# Patient Record
Sex: Male | Born: 1965 | Race: White | Hispanic: No | Marital: Married | Smoking: Never smoker
Health system: Southern US, Community
[De-identification: ages and names within clinical notes are randomized; demographics above are authoritative.]

## PROBLEM LIST (undated history)

## (undated) DIAGNOSIS — I10 Essential (primary) hypertension: Secondary | ICD-10-CM

## (undated) DIAGNOSIS — Z87442 Personal history of urinary calculi: Secondary | ICD-10-CM

## (undated) DIAGNOSIS — C4491 Basal cell carcinoma of skin, unspecified: Secondary | ICD-10-CM

## (undated) DIAGNOSIS — J309 Allergic rhinitis, unspecified: Secondary | ICD-10-CM

## (undated) HISTORY — DX: Personal history of urinary calculi: Z87.442

## (undated) HISTORY — DX: Basal cell carcinoma of skin, unspecified: C44.91

## (undated) HISTORY — DX: Essential (primary) hypertension: I10

## (undated) HISTORY — PX: COLONOSCOPY: SHX174

## (undated) HISTORY — DX: Allergic rhinitis, unspecified: J30.9

## (undated) HISTORY — PX: ADENOIDECTOMY: SHX5191

## (undated) HISTORY — PX: HERNIA REPAIR: SHX51

## (undated) HISTORY — PX: NASAL FRACTURE SURGERY: SHX718

---

## 2008-11-29 ENCOUNTER — Encounter: Payer: Self-pay | Admitting: Sports Medicine

## 2008-12-05 ENCOUNTER — Ambulatory Visit: Payer: Self-pay | Admitting: Sports Medicine

## 2008-12-05 ENCOUNTER — Ambulatory Visit (HOSPITAL_COMMUNITY): Admission: RE | Admit: 2008-12-05 | Discharge: 2008-12-05 | Payer: Self-pay | Admitting: Sports Medicine

## 2012-11-23 ENCOUNTER — Ambulatory Visit: Payer: Self-pay | Admitting: Unknown Physician Specialty

## 2016-07-06 ENCOUNTER — Encounter: Payer: Self-pay | Admitting: Family Medicine

## 2016-07-06 ENCOUNTER — Ambulatory Visit (INDEPENDENT_AMBULATORY_CARE_PROVIDER_SITE_OTHER): Payer: BLUE CROSS/BLUE SHIELD | Admitting: Family Medicine

## 2016-07-06 VITALS — BP 140/80 | HR 59 | Temp 97.8°F | Ht 68.75 in | Wt 203.2 lb

## 2016-07-06 DIAGNOSIS — Z125 Encounter for screening for malignant neoplasm of prostate: Secondary | ICD-10-CM | POA: Diagnosis not present

## 2016-07-06 DIAGNOSIS — R5383 Other fatigue: Secondary | ICD-10-CM

## 2016-07-06 DIAGNOSIS — Z0000 Encounter for general adult medical examination without abnormal findings: Secondary | ICD-10-CM

## 2016-07-06 DIAGNOSIS — Z13220 Encounter for screening for lipoid disorders: Secondary | ICD-10-CM | POA: Diagnosis not present

## 2016-07-06 NOTE — Progress Notes (Signed)
Pre visit review using our clinic review tool, if applicable. No additional management support is needed unless otherwise documented below in the visit note. 

## 2016-07-06 NOTE — Progress Notes (Signed)
Dr. Frederico Hamman T. Tavarion Babington, MD, [facility] Primary [facility] and Sports Medicine [place], [address] Phone: [phone] Fax: [phone]  07/06/2016  Patient: Alex Mack, MRN: [id number], DOB: [birth year], 50 y.o.  Primary Physician:  Owens Loffler, MD   Chief Complaint  Patient presents with  . Establish Care   Subjective:   Alex Mack is a 50 y.o. pleasant patient who presents with the following:  Preventative Health Maintenance Visit:  Health Maintenance Summary Reviewed and updated, unless pt declines services.  Tobacco History Reviewed. Alcohol: No concerns, no excessive use Exercise Habits: Very active STD concerns: no risk or activity to increase risk Drug Use: None Encouraged self-testicular check  Nodule r chest.  Health Maintenance  Topic Date Due  . Hepatitis C Screening  28-May-1966  . HIV Screening  04/10/1979  . TETANUS/TDAP  04/10/1983  . INFLUENZA VACCINE  05/19/2016  . COLONOSCOPY  10/19/2024    There is no immunization history on file for this patient. Patient Active Problem List   Diagnosis Date Noted  . Allergic rhinitis   . History of kidney stones    Past Medical History:  Diagnosis Date  . Allergic rhinitis   . Basal cell carcinoma   . History of kidney stones   . Hypertension    Past Surgical History:  Procedure Laterality Date  . ADENOIDECTOMY    . HERNIA REPAIR    . NASAL FRACTURE SURGERY     Social History   Social History  . Marital status: Married    Spouse name: N/A  . Number of children: N/A  . Years of education: N/A   Occupational History  . Physical Therapist     [employer]   Social History Main Topics  . Smoking status: Never Smoker  . Smokeless tobacco: Former Systems developer  . Alcohol use 1.2 oz/week    2 Cans of beer per week     Comment: 2 beer a month  . Drug use: No  . Sexual activity: Yes    Partners: Female   Other Topics Concern  . Not on file   Social  History Narrative   Physical Therapist, [place]   Married with Doctor, general practice. (Leisure centre manager)   Some MMA, Muay [place], BJJ in the past   Family History  Problem Relation Age of Onset  . Hypertension Father    No Known Allergies  Medication list has been reviewed and updated.   General: Denies fever, chills, sweats. No significant weight loss. Eyes: Denies blurring,significant itching ENT: Denies earache, sore throat, and hoarseness. Cardiovascular: Denies chest pains, palpitations, dyspnea on exertion Respiratory: Denies cough, dyspnea at rest,wheeezing Breast: no concerns about lumps GI: Denies nausea, vomiting, diarrhea, constipation, change in bowel habits, abdominal pain, melena, hematochezia GU: Denies penile discharge, ED, urinary flow / outflow problems. No STD concerns. Musculoskeletal: Denies back pain, joint pain Derm: Denies rash, itching Neuro: Denies  paresthesias, frequent falls, frequent headaches Psych: Denies depression, anxiety Endocrine: Denies cold intolerance, heat intolerance, polydipsia Heme: Denies enlarged lymph nodes Allergy: No hayfever  Objective:   BP 140/80   Pulse (!) 59   Temp 97.8 F (36.6 C) (Oral)   Ht 5' 8.75" (1.746 m)   Wt 203 lb 4 oz (92.2 kg)   BMI 30.23 kg/m  Ideal Body Weight: Weight in (lb) to have BMI = 25: 167.7  No exam data present  GEN: well developed, well nourished, no acute distress Eyes: conjunctiva and  lids normal, PERRLA, EOMI ENT: TM clear, nares clear, oral exam WNL Neck: supple, no lymphadenopathy, no thyromegaly, no JVD Pulm: clear to auscultation and percussion, respiratory effort normal CV: regular rate and rhythm, S1-S2, no murmur, rub or gallop, no bruits, peripheral pulses normal and symmetric, no cyanosis, clubbing, edema or varicosities GI: soft, non-tender; no hepatosplenomegaly, masses; active bowel sounds all quadrants GU: no hernia, testicular mass, penile discharge Lymph:  no cervical, axillary or inguinal adenopathy MSK: gait normal, muscle tone and strength WNL, no joint swelling, effusions, discoloration, crepitus  SKIN: clear, good turgor, color WNL, no rashes, lesions, or ulcerations. SMALL NODULE/ CYST AT RIGHT CHEST Neuro: normal mental status, normal strength, sensation, and motion Psych: alert; oriented to person, place and time, normally interactive and not anxious or depressed in appearance. All labs reviewed with patient.  Lipids: No results found for: CHOL, TRIG, HDL, LDLDIRECT, VLDL, CHOLHDL CBC: No flowsheet data found.  Basic Metabolic Panel: No results found for: NA, K, CL, CO2, BUN, CREATININE, GLUCOSE, CALCIUM No flowsheet data found.  No results found for: TSH No results found for: PSA  Assessment and Plan:   Healthcare maintenance  Screening PSA (prostate specific antigen) - Plan: PSA  Screening cholesterol level - Plan: Lipid panel  Other fatigue - Plan: Basic metabolic panel, CBC with Differential/Platelet, Hepatic function panel  Health Maintenance Exam: The patient's preventative maintenance and recommended screening tests for an annual wellness exam were reviewed in full today. Brought up to date unless services declined.  Counselled on the importance of diet, exercise, and its role in overall health and mortality. The patient's FH and SH was reviewed, including their home life, tobacco status, and drug and alcohol status.  Follow-up: No Follow-up on file. Unless noted, follow-up in 1 year for Health Maintenance Exam.  Orders Placed This Encounter  Procedures  . Basic metabolic panel  . CBC with Differential/Platelet  . Hepatic function panel  . PSA  . Lipid panel    Signed,  Frederico Hamman T. Clementina Mareno, MD   Patient's Medications   No medications on file

## 2016-07-07 ENCOUNTER — Encounter: Payer: Self-pay | Admitting: Family Medicine

## 2016-07-07 DIAGNOSIS — J309 Allergic rhinitis, unspecified: Secondary | ICD-10-CM | POA: Insufficient documentation

## 2016-07-07 DIAGNOSIS — Z87442 Personal history of urinary calculi: Secondary | ICD-10-CM | POA: Insufficient documentation

## 2016-07-16 ENCOUNTER — Ambulatory Visit: Payer: Self-pay | Admitting: Family Medicine

## 2016-09-02 ENCOUNTER — Other Ambulatory Visit (INDEPENDENT_AMBULATORY_CARE_PROVIDER_SITE_OTHER): Payer: BLUE CROSS/BLUE SHIELD

## 2016-09-02 ENCOUNTER — Encounter: Payer: Self-pay | Admitting: Family Medicine

## 2016-09-02 DIAGNOSIS — Z1159 Encounter for screening for other viral diseases: Secondary | ICD-10-CM

## 2016-09-02 DIAGNOSIS — Z114 Encounter for screening for human immunodeficiency virus [HIV]: Secondary | ICD-10-CM

## 2016-09-02 DIAGNOSIS — Z13220 Encounter for screening for lipoid disorders: Secondary | ICD-10-CM | POA: Diagnosis not present

## 2016-09-02 DIAGNOSIS — Z125 Encounter for screening for malignant neoplasm of prostate: Secondary | ICD-10-CM

## 2016-09-02 DIAGNOSIS — R5383 Other fatigue: Secondary | ICD-10-CM

## 2016-09-03 LAB — HEPATITIS C ANTIBODY: HCV AB: NEGATIVE

## 2016-09-03 LAB — HEPATIC FUNCTION PANEL
ALBUMIN: 4.3 g/dL (ref 3.5–5.2)
ALT: 37 U/L (ref 0–53)
AST: 24 U/L (ref 0–37)
Alkaline Phosphatase: 75 U/L (ref 39–117)
BILIRUBIN DIRECT: 0.1 mg/dL (ref 0.0–0.3)
TOTAL PROTEIN: 7.1 g/dL (ref 6.0–8.3)
Total Bilirubin: 0.3 mg/dL (ref 0.2–1.2)

## 2016-09-03 LAB — CBC WITH DIFFERENTIAL/PLATELET
BASOS PCT: 1 % (ref 0.0–3.0)
Basophils Absolute: 0.1 10*3/uL (ref 0.0–0.1)
EOS ABS: 0.3 10*3/uL (ref 0.0–0.7)
EOS PCT: 3.1 % (ref 0.0–5.0)
HEMATOCRIT: 44.4 % (ref 39.0–52.0)
Hemoglobin: 15.2 g/dL (ref 13.0–17.0)
LYMPHS PCT: 31.4 % (ref 12.0–46.0)
Lymphs Abs: 2.9 10*3/uL (ref 0.7–4.0)
MCHC: 34.2 g/dL (ref 30.0–36.0)
MCV: 92.4 fl (ref 78.0–100.0)
Monocytes Absolute: 0.7 10*3/uL (ref 0.1–1.0)
Monocytes Relative: 7.1 % (ref 3.0–12.0)
NEUTROS ABS: 5.3 10*3/uL (ref 1.4–7.7)
Neutrophils Relative %: 57.4 % (ref 43.0–77.0)
PLATELETS: 260 10*3/uL (ref 150.0–400.0)
RBC: 4.8 Mil/uL (ref 4.22–5.81)
RDW: 12.8 % (ref 11.5–15.5)
WBC: 9.2 10*3/uL (ref 4.0–10.5)

## 2016-09-03 LAB — BASIC METABOLIC PANEL
BUN: 20 mg/dL (ref 6–23)
CHLORIDE: 104 meq/L (ref 96–112)
CO2: 30 meq/L (ref 19–32)
CREATININE: 0.95 mg/dL (ref 0.40–1.50)
Calcium: 9.5 mg/dL (ref 8.4–10.5)
GFR: 88.35 mL/min (ref >60.00)
Glucose, Bld: 85 mg/dL (ref 70–99)
POTASSIUM: 4.5 meq/L (ref 3.5–5.1)
Sodium: 141 mEq/L (ref 135–145)

## 2016-09-03 LAB — HIV ANTIBODY (ROUTINE TESTING W REFLEX): HIV 1&2 Ab, 4th Generation: NONREACTIVE

## 2016-09-03 LAB — LIPID PANEL
Cholesterol: 151 mg/dL (ref 0–200)
HDL: 38.5 mg/dL — AB (ref >39.00)
LDL Cholesterol: 84 mg/dL (ref 0–99)
NONHDL: 112.06
Total CHOL/HDL Ratio: 4
Triglycerides: 141 mg/dL (ref 0.0–149.0)
VLDL: 28.2 mg/dL (ref 0.0–40.0)

## 2016-09-03 LAB — PSA: PSA: 1.23 ng/mL (ref 0.10–4.00)

## 2016-09-04 ENCOUNTER — Encounter: Payer: Self-pay | Admitting: Radiology

## 2016-09-04 NOTE — Progress Notes (Signed)
LMOM for pt to call and schedule a morning, fasting lab appt.

## 2016-09-09 ENCOUNTER — Other Ambulatory Visit: Payer: Self-pay | Admitting: Family Medicine

## 2016-09-09 DIAGNOSIS — R5383 Other fatigue: Secondary | ICD-10-CM

## 2016-09-14 ENCOUNTER — Other Ambulatory Visit (INDEPENDENT_AMBULATORY_CARE_PROVIDER_SITE_OTHER): Payer: BLUE CROSS/BLUE SHIELD

## 2016-09-14 DIAGNOSIS — R5383 Other fatigue: Secondary | ICD-10-CM

## 2016-09-17 ENCOUNTER — Telehealth: Payer: Self-pay | Admitting: Family Medicine

## 2016-09-17 NOTE — Telephone Encounter (Signed)
Pt called checking on his labs from 11/27.

## 2016-09-17 NOTE — Telephone Encounter (Signed)
Test is still is process.  Spoke with Aniceto Boss in the lab who states this tests takes about a week to get results back.  Mr. Nanna notified.

## 2016-09-19 LAB — TESTOS,TOTAL,FREE AND SHBG (FEMALE)
SEX HORMONE BINDING GLOB.: 36 nmol/L (ref 10–50)
TESTOSTERONE,TOTAL,LC/MS/MS: 574 ng/dL (ref 250–1100)
Testosterone, Free: 86.1 pg/mL (ref 35.0–155.0)

## 2016-09-20 ENCOUNTER — Encounter: Payer: Self-pay | Admitting: Family Medicine

## 2016-12-16 ENCOUNTER — Encounter: Payer: Self-pay | Admitting: Family Medicine

## 2017-08-09 ENCOUNTER — Encounter: Payer: Self-pay | Admitting: Family Medicine

## 2017-08-18 ENCOUNTER — Ambulatory Visit (INDEPENDENT_AMBULATORY_CARE_PROVIDER_SITE_OTHER): Payer: BLUE CROSS/BLUE SHIELD | Admitting: Family Medicine

## 2017-08-18 ENCOUNTER — Encounter: Payer: Self-pay | Admitting: Family Medicine

## 2017-08-18 VITALS — BP 118/84 | HR 67 | Temp 97.4°F | Ht 69.0 in | Wt 201.5 lb

## 2017-08-18 DIAGNOSIS — Z23 Encounter for immunization: Secondary | ICD-10-CM | POA: Diagnosis not present

## 2017-08-18 DIAGNOSIS — R5383 Other fatigue: Secondary | ICD-10-CM

## 2017-08-18 DIAGNOSIS — Z0000 Encounter for general adult medical examination without abnormal findings: Secondary | ICD-10-CM

## 2017-08-18 DIAGNOSIS — Z125 Encounter for screening for malignant neoplasm of prostate: Secondary | ICD-10-CM

## 2017-08-18 LAB — BASIC METABOLIC PANEL
BUN: 21 mg/dL (ref 6–23)
CALCIUM: 9.3 mg/dL (ref 8.4–10.5)
CHLORIDE: 105 meq/L (ref 96–112)
CO2: 31 meq/L (ref 19–32)
Creatinine, Ser: 1.03 mg/dL (ref 0.40–1.50)
GFR: 80.18 mL/min (ref >60.00)
Glucose, Bld: 94 mg/dL (ref 70–99)
POTASSIUM: 4.7 meq/L (ref 3.5–5.1)
SODIUM: 140 meq/L (ref 135–145)

## 2017-08-18 LAB — LIPID PANEL
Cholesterol: 151 mg/dL (ref 0–200)
HDL: 47 mg/dL (ref >39.00)
LDL Cholesterol: 92 mg/dL (ref 0–99)
NonHDL: 104.14
TRIGLYCERIDES: 62 mg/dL (ref 0.0–149.0)
Total CHOL/HDL Ratio: 3
VLDL: 12.4 mg/dL (ref 0.0–40.0)

## 2017-08-18 LAB — CBC WITH DIFFERENTIAL/PLATELET
BASOS ABS: 0 10*3/uL (ref 0.0–0.1)
BASOS PCT: 0.7 % (ref 0.0–3.0)
Eosinophils Absolute: 0.3 10*3/uL (ref 0.0–0.7)
Eosinophils Relative: 5.6 % — ABNORMAL HIGH (ref 0.0–5.0)
HEMATOCRIT: 48.6 % (ref 39.0–52.0)
Hemoglobin: 16.4 g/dL (ref 13.0–17.0)
LYMPHS ABS: 2.1 10*3/uL (ref 0.7–4.0)
LYMPHS PCT: 41.7 % (ref 12.0–46.0)
MCHC: 33.8 g/dL (ref 30.0–36.0)
MCV: 95.3 fl (ref 78.0–100.0)
Monocytes Absolute: 0.5 10*3/uL (ref 0.1–1.0)
Monocytes Relative: 9.3 % (ref 3.0–12.0)
NEUTROS ABS: 2.1 10*3/uL (ref 1.4–7.7)
NEUTROS PCT: 42.7 % — AB (ref 43.0–77.0)
PLATELETS: 205 10*3/uL (ref 150.0–400.0)
RBC: 5.09 Mil/uL (ref 4.22–5.81)
RDW: 13 % (ref 11.5–15.5)
WBC: 5 10*3/uL (ref 4.0–10.5)

## 2017-08-18 LAB — HEPATIC FUNCTION PANEL
ALBUMIN: 4.2 g/dL (ref 3.5–5.2)
ALK PHOS: 75 U/L (ref 39–117)
ALT: 55 U/L — ABNORMAL HIGH (ref 0–53)
AST: 34 U/L (ref 0–37)
Bilirubin, Direct: 0.1 mg/dL (ref 0.0–0.3)
TOTAL PROTEIN: 6.9 g/dL (ref 6.0–8.3)
Total Bilirubin: 0.7 mg/dL (ref 0.2–1.2)

## 2017-08-18 LAB — PSA: PSA: 2.1 ng/mL (ref 0.10–4.00)

## 2017-08-18 NOTE — Progress Notes (Signed)
Dr. Frederico Hamman T. Sevan Mcbroom, MD, [facility] Primary Care and Sports Medicine [place], [address] Phone: [phone] Fax: [phone]  08/18/2017  Patient: Alex Mack, MRN: [id number], DOB: 1966-05-18, 51 y.o.  Primary Physician:  Owens Loffler, MD   Chief Complaint  Patient presents with  . Annual Exam   Subjective:   Alex Mack is a 51 y.o. pleasant patient who presents with the following:  Preventative Health Maintenance Visit:  Health Maintenance Summary Reviewed and updated, unless pt declines services.  Tobacco History Reviewed. Alcohol: No concerns, no excessive use Exercise Habits: Daily, very active STD concerns: no risk or activity to increase risk Drug Use: None Encouraged self-testicular check  Doing well overall with little complaints other than some about recovery  Health Maintenance  Topic Date Due  . TETANUS/TDAP  08/18/2018 (Originally 04/10/1983)  . COLONOSCOPY  10/19/2024  . INFLUENZA VACCINE  Completed  . Hepatitis C Screening  Completed  . HIV Screening  Completed   Immunization History  Administered Date(s) Administered  . Influenza,inj,Quad PF,6+ Mos 08/18/2017   Patient Active Problem List   Diagnosis Date Noted  . Allergic rhinitis   . History of kidney stones    Past Medical History:  Diagnosis Date  . Allergic rhinitis   . Basal cell carcinoma   . History of kidney stones   . Hypertension    Past Surgical History:  Procedure Laterality Date  . ADENOIDECTOMY    . HERNIA REPAIR    . NASAL FRACTURE SURGERY     Social History   Social History  . Marital status: Married    Spouse name: N/A  . Number of children: N/A  . Years of education: N/A   Occupational History  . Physical Therapist     [employer]   Social History Main Topics  . Smoking status: Never Smoker  . Smokeless tobacco: Former Systems developer  . Alcohol use 1.2 oz/week    2 Cans of beer per week     Comment: 2 beer a  month  . Drug use: No  . Sexual activity: Yes    Partners: Female   Other Topics Concern  . 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 (Leisure centre manager)   Some MMA, Muay [place], BJJ in the past   Family History  Problem Relation Age of Onset  . Hypertension Father    No Known Allergies  Medication list has been reviewed and updated.   General: Denies fever, chills, sweats. No significant weight loss. Eyes: Denies blurring,significant itching ENT: Denies earache, sore throat, and hoarseness. Cardiovascular: Denies chest pains, palpitations, dyspnea on exertion Respiratory: Denies cough, dyspnea at rest,wheeezing Breast: no concerns about lumps GI: Denies nausea, vomiting, diarrhea, constipation, change in bowel habits, abdominal pain, melena, hematochezia GU: Denies penile discharge, ED, urinary flow / outflow problems. No STD concerns. Musculoskeletal: Denies back pain, joint pain Derm: Denies rash, itching Neuro: Denies  paresthesias, frequent falls, frequent headaches Psych: Denies depression, anxiety Endocrine: Denies cold intolerance, heat intolerance, polydipsia Heme: Denies enlarged lymph nodes Allergy: No hayfever  Objective:   BP 118/84   Pulse 67   Temp (!) 97.4 F (36.3 C) (Oral)   Ht 5' 9"  (1.753 m)   Wt 201 lb 8 oz (91.4 kg)   BMI 29.76 kg/m  Ideal Body Weight: Weight in (lb) to have BMI = 25: 168.9  No exam  data present  GEN: well developed, well nourished, no acute distress Eyes: conjunctiva and lids normal, PERRLA, EOMI ENT: TM clear, nares clear, oral exam WNL Neck: supple, no lymphadenopathy, no thyromegaly, no JVD Pulm: clear to auscultation and percussion, respiratory effort normal CV: regular rate and rhythm, S1-S2, no murmur, rub or gallop, no bruits, peripheral pulses normal and symmetric, no cyanosis, clubbing, edema or varicosities GI: soft, non-tender; no  hepatosplenomegaly, masses; active bowel sounds all quadrants GU: no hernia, testicular mass, penile discharge Lymph: no cervical, axillary or inguinal adenopathy MSK: gait normal, muscle tone and strength WNL, no joint swelling, effusions, discoloration, crepitus  SKIN: clear, good turgor, color WNL, no rashes, lesions, or ulcerations Neuro: normal mental status, normal strength, sensation, and motion Psych: alert; oriented to person, place and time, normally interactive and not anxious or depressed in appearance. All labs reviewed with patient.  Lipids:    Component Value Date/Time   CHOL 151 09/02/2016 1812   TRIG 141.0 09/02/2016 1812   HDL 38.50 (L) 09/02/2016 1812   VLDL 28.2 09/02/2016 1812   CHOLHDL 4 09/02/2016 1812   CBC: CBC Latest Ref Rng & Units 09/02/2016  WBC 4.0 - 10.5 K/uL 9.2  Hemoglobin 13.0 - 17.0 g/dL 15.2  Hematocrit 39.0 - 52.0 % 44.4  Platelets 150.0 - 400.0 K/uL 016.0    Basic Metabolic Panel:    Component Value Date/Time   NA 141 09/02/2016 1812   K 4.5 09/02/2016 1812   CL 104 09/02/2016 1812   CO2 30 09/02/2016 1812   BUN 20 09/02/2016 1812   CREATININE 0.95 09/02/2016 1812   GLUCOSE 85 09/02/2016 1812   CALCIUM 9.5 09/02/2016 1812   Hepatic Function Latest Ref Rng & Units 09/02/2016  Total Protein 6.0 - 8.3 g/dL 7.1  Albumin 3.5 - 5.2 g/dL 4.3  AST 0 - 37 U/L 24  ALT 0 - 53 U/L 37  Alk Phosphatase 39 - 117 U/L 75  Total Bilirubin 0.2 - 1.2 mg/dL 0.3  Bilirubin, Direct 0.0 - 0.3 mg/dL 0.1    No results found for: TSH Lab Results  Component Value Date   PSA 1.23 09/02/2016    Assessment and Plan:   Healthcare maintenance - Plan: Basic metabolic panel, CBC with Differential/Platelet, Hepatic function panel, Lipid panel, PSA  Other fatigue - Plan: Testos,Total,Free and SHBG (Male)  Need for prophylactic vaccination and inoculation against influenza - Plan: Flu Vaccine QUAD 36+ mos IM  Health Maintenance Exam: The patient's  preventative maintenance and recommended screening tests for an annual wellness exam were reviewed in full today. Brought up to date unless services declined.  Counselled on the importance of diet, exercise, and its role in overall health and mortality. The patient's FH and SH was reviewed, including their home life, tobacco status, and drug and alcohol status.  Follow-up in 1 year for physical exam or additional follow-up below.  Follow-up: No Follow-up on file. Or follow-up in 1 year if not noted.  Orders Placed This Encounter  Procedures  . Flu Vaccine QUAD 36+ mos IM  . Basic metabolic panel  . CBC with Differential/Platelet  . Hepatic function panel  . Lipid panel  . PSA  . Testos,Total,Free and SHBG (Male)    Signed,  Frederico Hamman T. Damiano Stamper, MD   Allergies as of 08/18/2017   No Known Allergies     Medication List    as of 08/18/2017  9:42 AM   You have not been prescribed any medications.

## 2017-08-22 LAB — TESTOS,TOTAL,FREE AND SHBG (FEMALE)
FREE TESTOSTERONE: 73.6 pg/mL (ref 35.0–155.0)
Sex Hormone Binding: 35 nmol/L (ref 10–50)
Testosterone, Total, LC-MS-MS: 481 ng/dL (ref 250–1100)

## 2017-10-22 ENCOUNTER — Encounter: Payer: Self-pay | Admitting: Family Medicine

## 2019-01-12 ENCOUNTER — Encounter: Payer: BLUE CROSS/BLUE SHIELD | Admitting: Family Medicine

## 2019-03-22 ENCOUNTER — Telehealth: Payer: Self-pay | Admitting: Family Medicine

## 2019-03-22 NOTE — Telephone Encounter (Signed)
error 

## 2019-03-23 ENCOUNTER — Other Ambulatory Visit: Payer: Self-pay

## 2019-03-23 ENCOUNTER — Encounter: Payer: Self-pay | Admitting: Family Medicine

## 2019-03-23 ENCOUNTER — Ambulatory Visit (INDEPENDENT_AMBULATORY_CARE_PROVIDER_SITE_OTHER): Payer: PRIVATE HEALTH INSURANCE | Admitting: Family Medicine

## 2019-03-23 VITALS — BP 120/76 | HR 60 | Temp 98.2°F | Ht 69.0 in | Wt 200.0 lb

## 2019-03-23 DIAGNOSIS — Z0000 Encounter for general adult medical examination without abnormal findings: Secondary | ICD-10-CM | POA: Diagnosis not present

## 2019-03-23 DIAGNOSIS — Z125 Encounter for screening for malignant neoplasm of prostate: Secondary | ICD-10-CM

## 2019-03-23 DIAGNOSIS — Z23 Encounter for immunization: Secondary | ICD-10-CM

## 2019-03-23 DIAGNOSIS — Z131 Encounter for screening for diabetes mellitus: Secondary | ICD-10-CM | POA: Diagnosis not present

## 2019-03-23 LAB — CBC WITH DIFFERENTIAL/PLATELET
Basophils Absolute: 0 10*3/uL (ref 0.0–0.1)
Basophils Relative: 0.5 % (ref 0.0–3.0)
Eosinophils Absolute: 0.3 10*3/uL (ref 0.0–0.7)
Eosinophils Relative: 5.9 % — ABNORMAL HIGH (ref 0.0–5.0)
HCT: 47.9 % (ref 39.0–52.0)
Hemoglobin: 16.3 g/dL (ref 13.0–17.0)
Lymphocytes Relative: 36.1 % (ref 12.0–46.0)
Lymphs Abs: 1.7 10*3/uL (ref 0.7–4.0)
MCHC: 34.1 g/dL (ref 30.0–36.0)
MCV: 94.6 fl (ref 78.0–100.0)
Monocytes Absolute: 0.4 10*3/uL (ref 0.1–1.0)
Monocytes Relative: 9.2 % (ref 3.0–12.0)
Neutro Abs: 2.3 10*3/uL (ref 1.4–7.7)
Neutrophils Relative %: 48.3 % (ref 43.0–77.0)
Platelets: 196 10*3/uL (ref 150.0–400.0)
RBC: 5.06 Mil/uL (ref 4.22–5.81)
RDW: 12.8 % (ref 11.5–15.5)
WBC: 4.7 10*3/uL (ref 4.0–10.5)

## 2019-03-23 LAB — HEPATIC FUNCTION PANEL
ALT: 47 U/L (ref 0–53)
AST: 30 U/L (ref 0–37)
Albumin: 4.2 g/dL (ref 3.5–5.2)
Alkaline Phosphatase: 83 U/L (ref 39–117)
Bilirubin, Direct: 0.1 mg/dL (ref 0.0–0.3)
Total Bilirubin: 0.7 mg/dL (ref 0.2–1.2)
Total Protein: 6.9 g/dL (ref 6.0–8.3)

## 2019-03-23 LAB — BASIC METABOLIC PANEL
BUN: 19 mg/dL (ref 6–23)
CO2: 28 mEq/L (ref 19–32)
Calcium: 9.4 mg/dL (ref 8.4–10.5)
Chloride: 105 mEq/L (ref 96–112)
Creatinine, Ser: 1.19 mg/dL (ref 0.40–1.50)
GFR: 63.48 mL/min (ref >60.00)
Glucose, Bld: 96 mg/dL (ref 70–99)
Potassium: 4.8 mEq/L (ref 3.5–5.1)
Sodium: 141 mEq/L (ref 135–145)

## 2019-03-23 LAB — LIPID PANEL
Cholesterol: 164 mg/dL (ref 0–200)
HDL: 50.9 mg/dL (ref >39.00)
LDL Cholesterol: 100 mg/dL — ABNORMAL HIGH (ref 0–99)
NonHDL: 113.39
Total CHOL/HDL Ratio: 3
Triglycerides: 67 mg/dL (ref 0.0–149.0)
VLDL: 13.4 mg/dL (ref 0.0–40.0)

## 2019-03-23 LAB — HEMOGLOBIN A1C: Hgb A1c MFr Bld: 5.5 % (ref 4.6–6.5)

## 2019-03-23 NOTE — Progress Notes (Signed)
Garielle Mroz T. Pink Maye, MD Primary Care and [place] at [facility] [place], [address] Phone: [phone]  FAX: [phone]  DENIZ HANNAN - 53 y.o. male  MRN [id number]  Date of Birth: 01-Apr-1966  Visit Date: 03/23/2019  PCP: Owens Loffler, MD  Referred by: Owens Loffler, MD  Chief Complaint  Patient presents with  . Annual Exam   Patient Care Team: Owens Loffler, MD as PCP - General (Family Medicine) Subjective:   Alex Mack is a 53 y.o. pleasant patient who presents with the following:  Preventative Health Maintenance Visit:  Health Maintenance Summary Reviewed and updated, unless pt declines services.  Tobacco History Reviewed. Alcohol: No concerns, no excessive use.  Rare. Exercise Habits: Some activity, rec at least 30 mins 5 times a week STD concerns: no risk or activity to increase risk.  Monogamous.  Drug Use: None Encouraged self-testicular check  Tdap booster  Still working out a lot. Doing really well.   Health Maintenance  Topic Date Due  . TETANUS/TDAP  04/10/1983  . INFLUENZA VACCINE  05/20/2019  . COLONOSCOPY  10/19/2024  . Hepatitis C Screening  Completed  . HIV Screening  Completed   Immunization History  Administered Date(s) Administered  . Influenza,inj,Quad PF,6+ Mos 08/18/2017  . Tdap 03/23/2019   Patient Active Problem List   Diagnosis Date Noted  . Allergic rhinitis   . History of kidney stones    Past Medical History:  Diagnosis Date  . Allergic rhinitis   . Basal cell carcinoma   . History of kidney stones   . Hypertension    Past Surgical History:  Procedure Laterality Date  . ADENOIDECTOMY    . HERNIA REPAIR    . NASAL FRACTURE SURGERY     Social History   Socioeconomic History  . Marital status: Married    Spouse name: Not on file  . Number of children: Not on file  . Years of education: Not on file  . Highest education level: Not on file   Occupational History  . Occupation: Physical Therapist    Comment: Administrator, [employer]  Social Needs  . Financial resource strain: Not on file  . Food insecurity:    Worry: Not on file    Inability: Not on file  . Transportation needs:    Medical: Not on file    Non-medical: Not on file  Tobacco Use  . Smoking status: Never Smoker  . Smokeless tobacco: Former Network engineer and Sexual Activity  . Alcohol use: Yes    Alcohol/week: 2.0 standard drinks    Types: 2 Cans of beer per week    Comment: 2 beer a month  . Drug use: No  . Sexual activity: Yes    Partners: Female  Lifestyle  . Physical activity:    Days per week: Not on file    Minutes per session: Not on file  . Stress: Not on file  Relationships  . Social connections:    Talks on phone: Not on file    Gets together: Not on file    Attends religious service: Not on file    Active member of club or organization: Not on file    Attends meetings of clubs or organizations: Not on file    Relationship status: Not on file  . Intimate partner violence:    Fear of current or ex partner: Not on file    Emotionally abused: Not on file  Physically abused: Not on file    Forced sexual activity: Not on file  Other Topics Concern  . Not on file  Social History Narrative   Physical Therapist, [place]   Married with Doctor, general practice. (Leisure centre manager)   Some MMA, Muay [place], BJJ in the past   Family History  Problem Relation Age of Onset  . Hypertension Father    No Known Allergies  Medication list has been reviewed and updated.   General: Denies fever, chills, sweats. No significant weight loss. Eyes: Denies blurring,significant itching ENT: Denies earache, sore throat, and hoarseness. Cardiovascular: Denies chest pains, palpitations, dyspnea on exertion Respiratory: Denies cough, dyspnea at rest,wheeezing Breast: no concerns about lumps GI: Denies nausea, vomiting, diarrhea,  constipation, change in bowel habits, abdominal pain, melena, hematochezia GU: Denies penile discharge, ED, urinary flow / outflow problems. No STD concerns. Musculoskeletal: Denies back pain, joint pain Derm: Denies rash, itching Neuro: Denies  paresthesias, frequent falls, frequent headaches Psych: Denies depression, anxiety Endocrine: Denies cold intolerance, heat intolerance, polydipsia Heme: Denies enlarged lymph nodes Allergy: No hayfever  Objective:   BP 120/76 (BP Location: Left Arm, Patient Position: Sitting, Cuff Size: Large)   Pulse 60   Temp 98.2 F (36.8 C) (Oral)   Ht 5\' 9"  (1.753 m)   Wt 200 lb (90.7 kg)   SpO2 98%   BMI 29.53 kg/m  Ideal Body Weight: Weight in (lb) to have BMI = 25: 168.9  Ideal Body Weight: Weight in (lb) to have BMI = 25: 168.9 No exam data present Depression screen [facility] 2/9 03/23/2019 08/18/2017  Decreased Interest 0 0  Down, Depressed, Hopeless 0 0  PHQ - 2 Score 0 0   GEN: well developed, well nourished, no acute distress Eyes: conjunctiva and lids normal, PERRLA, EOMI ENT: TM clear, nares clear, oral exam WNL Neck: supple, no lymphadenopathy, no thyromegaly, no JVD Pulm: clear to auscultation and percussion, respiratory effort normal CV: regular rate and rhythm, S1-S2, no murmur, rub or gallop, no bruits, peripheral pulses normal and symmetric, no cyanosis, clubbing, edema or varicosities GI: soft, non-tender; no hepatosplenomegaly, masses; active bowel sounds all quadrants GU: no hernia, testicular mass, penile discharge Lymph: no cervical, axillary or inguinal adenopathy MSK: gait normal, muscle tone and strength WNL, no joint swelling, effusions, discoloration, crepitus  SKIN: clear, good turgor, color WNL, no rashes, lesions, or ulcerations Neuro: normal mental status, normal strength, sensation, and motion Psych: alert; oriented to person, place and time, normally interactive and not anxious or depressed in appearance.  All labs  reviewed with patient. Results for orders placed or performed in visit on 82/99/37  Basic metabolic panel  Result Value Ref Range   Sodium 140 135 - 145 mEq/L   Potassium 4.7 3.5 - 5.1 mEq/L   Chloride 105 96 - 112 mEq/L   CO2 31 19 - 32 mEq/L   Glucose, Bld 94 70 - 99 mg/dL   BUN 21 6 - 23 mg/dL   Creatinine, Ser 1.03 0.40 - 1.50 mg/dL   Calcium 9.3 8.4 - 10.5 mg/dL   GFR 80.18 >60.00 mL/min  CBC with Differential/Platelet  Result Value Ref Range   WBC 5.0 4.0 - 10.5 K/uL   RBC 5.09 4.22 - 5.81 Mil/uL   Hemoglobin 16.4 13.0 - 17.0 g/dL   HCT 48.6 39.0 - 52.0 %   MCV 95.3 78.0 - 100.0 fl   MCHC 33.8 30.0 - 36.0 g/dL   RDW 13.0 11.5 - 15.5 %  Platelets 205.0 150.0 - 400.0 K/uL   Neutrophils Relative % 42.7 (L) 43.0 - 77.0 %   Lymphocytes Relative 41.7 12.0 - 46.0 %   Monocytes Relative 9.3 3.0 - 12.0 %   Eosinophils Relative 5.6 (H) 0.0 - 5.0 %   Basophils Relative 0.7 0.0 - 3.0 %   Neutro Abs 2.1 1.4 - 7.7 K/uL   Lymphs Abs 2.1 0.7 - 4.0 K/uL   Monocytes Absolute 0.5 0.1 - 1.0 K/uL   Eosinophils Absolute 0.3 0.0 - 0.7 K/uL   Basophils Absolute 0.0 0.0 - 0.1 K/uL  Hepatic function panel  Result Value Ref Range   Total Bilirubin 0.7 0.2 - 1.2 mg/dL   Bilirubin, Direct 0.1 0.0 - 0.3 mg/dL   Alkaline Phosphatase 75 39 - 117 U/L   AST 34 0 - 37 U/L   ALT 55 (H) 0 - 53 U/L   Total Protein 6.9 6.0 - 8.3 g/dL   Albumin 4.2 3.5 - 5.2 g/dL  Lipid panel  Result Value Ref Range   Cholesterol 151 0 - 200 mg/dL   Triglycerides 62.0 0.0 - 149.0 mg/dL   HDL 47.00 >39.00 mg/dL   VLDL 12.4 0.0 - 40.0 mg/dL   LDL Cholesterol 92 0 - 99 mg/dL   Total CHOL/HDL Ratio 3    NonHDL 104.14   PSA  Result Value Ref Range   PSA 2.10 0.10 - 4.00 ng/mL  Testos,Total,Free and SHBG (Male)  Result Value Ref Range   Testosterone, Total, LC-MS-MS 481 250 - 1,100 ng/dL   Free Testosterone 73.6 35.0 - 155.0 pg/mL   Sex Hormone Binding 35 10 - 50 nmol/L    Assessment and Plan:   Healthcare  maintenance - Plan: CBC with Differential/Platelet, Basic metabolic panel, Hepatic function panel, Hemoglobin A1c, Lipid panel, PSA, total and free  Screening for diabetes mellitus - Plan: Hemoglobin A1c  Screening PSA (prostate specific antigen) - Plan: PSA, total and free  Need for Tdap vaccination - Plan: Tdap vaccine greater than or equal to 7yo IM  Doing really well Update Tdap today  Health Maintenance Exam: The patient's preventative maintenance and recommended screening tests for an annual wellness exam were reviewed in full today. Brought up to date unless services declined.  Counselled on the importance of diet, exercise, and its role in overall health and mortality. The patient's FH and SH was reviewed, including their home life, tobacco status, and drug and alcohol status.  Follow-up in 1 year for physical exam or additional follow-up below.  Follow-up: No follow-ups on file. Or follow-up in 1 year if not noted.  No orders of the defined types were placed in this encounter.  There are no discontinued medications. Orders Placed This Encounter  Procedures  . Tdap vaccine greater than or equal to 7yo IM  . CBC with Differential/Platelet  . Basic metabolic panel  . Hepatic function panel  . Hemoglobin A1c  . Lipid panel  . PSA, total and free    Signed,  Shabria Egley T. Sareena Odeh, MD   Allergies as of 03/23/2019   No Known Allergies     Medication List       Accurate as of March 23, 2019 11:37 AM. If you have any questions, ask your nurse or doctor.        aspirin 81 MG EC tablet Take 81 mg by mouth daily. Swallow whole.   CREATINE PO Take by mouth.

## 2019-03-24 LAB — PSA, TOTAL AND FREE
PSA, % Free: 25 % (calc) — ABNORMAL LOW (ref >25)
PSA, Free: 0.3 ng/mL
PSA, Total: 1.2 ng/mL (ref <=4.0)

## 2019-10-27 ENCOUNTER — Encounter: Payer: Self-pay | Admitting: Family Medicine

## 2019-12-06 ENCOUNTER — Ambulatory Visit (INDEPENDENT_AMBULATORY_CARE_PROVIDER_SITE_OTHER): Payer: PRIVATE HEALTH INSURANCE | Admitting: Family Medicine

## 2019-12-06 ENCOUNTER — Other Ambulatory Visit: Payer: Self-pay

## 2019-12-06 ENCOUNTER — Encounter: Payer: Self-pay | Admitting: Family Medicine

## 2019-12-06 VITALS — BP 142/90 | HR 60 | Temp 98.4°F | Ht 69.0 in | Wt 203.5 lb

## 2019-12-06 DIAGNOSIS — N6310 Unspecified lump in the right breast, unspecified quadrant: Secondary | ICD-10-CM

## 2019-12-06 NOTE — Progress Notes (Signed)
Alex Tortorella T. Kiano Terrien, MD Primary Care and [place] at [facility] [place], [address] Phone: [phone]  FAX: [phone]  Alex Mack - 54 y.o. male  MRN [id number]  Date of Birth: 1966/09/07  Visit Date: 12/06/2019  PCP: Owens Loffler, MD  Referred by: Owens Loffler, MD  Chief Complaint  Patient presents with  . Knot on Chest Wall    This visit occurred during the SARS-CoV-2 public health emergency.  Safety protocols were in place, including screening questions prior to the visit, additional usage of staff PPE, and extensive cleaning of exam room while observing appropriate contact time as indicated for disinfecting solutions.   Subjective:   Alex Mack is a 54 y.o. very pleasant male patient who presents with the following:  He has an knot/mass on his right anterior dell.  We have been observing this time, but he does think it may have gotten bigger.  He also has lost weight and lost fat in his upper chest area as well.  Is not causing him any pain at all.  He has no history of trauma in this region.  He is not noticed any lymph nodes in his eye.  He otherwise is feeling well, but he does have a history of some skin cancers.  Otherwise he is a very healthy 54 year old gentleman.  He is quite physically active, he is doing a lot of Olympic lifting now as well as interval training.  Knot on the chest wall.  Does feel bigger   Has been doing a lot of oly,poc lifting.   R breast mass, 2 o/clock  Review of Systems is noted in the HPI, as appropriate  Objective:   BP (!) 142/90   Pulse 60   Temp 98.4 F (36.9 C) (Temporal)   Ht 5\' 9"  (1.753 m)   Wt 203 lb 8 oz (92.3 kg)   SpO2 96%   BMI 30.05 kg/m   GEN: No acute distress; alert,appropriate. PULM: Breathing comfortably in no respiratory distress PSYCH: Normally interactive.   On the right chest wall at approximately 2:00 there is a mobile area  that is approximately 1 and three-quarter centimeter across.  Otherwise the chest exam is within normal limits and there are no lymph nodes in the axilla on the right  Laboratory and Imaging Data:  Assessment and Plan:     ICD-10-CM   1. Breast mass, right  N63.10 MM DIAG BREAST TOMO BILATERAL    US BREAST LTD UNI RIGHT INC AXILLA   Level of Medical Decision-Making in this case is MODERATE.   In the setting of a male with enlarging area/mass in the right upper quadrant of his breast tissue, obtain a bilateral diagnostic mammogram found to evaluate for potential neoplasm or other pathology.  He is well aware that a breast cancer could potentially be possible in a male.  If all of this testing is negative, then I do not think that there would be any kind of significant risk in the future.  Follow-up: No follow-ups on file.  No orders of the defined types were placed in this encounter.  There are no discontinued medications. Orders Placed This Encounter  Procedures  . MM DIAG BREAST TOMO BILATERAL  . US BREAST LTD UNI RIGHT INC AXILLA    Signed,  Alex Goettel T. Rhiann Boucher, MD   Outpatient Encounter Medications as of 12/06/2019  Medication Sig  . aspirin 81 MG EC tablet Take 81  mg by mouth daily. Swallow whole.  Marland Kitchen CREATINE PO Take by mouth.   No facility-administered encounter medications on file as of 12/06/2019.

## 2019-12-08 ENCOUNTER — Encounter: Payer: Self-pay | Admitting: Family Medicine

## 2019-12-13 ENCOUNTER — Ambulatory Visit: Payer: PRIVATE HEALTH INSURANCE | Admitting: Family Medicine

## 2019-12-14 ENCOUNTER — Ambulatory Visit
Admission: RE | Admit: 2019-12-14 | Discharge: 2019-12-14 | Disposition: A | Discharge status: Home / Self Care | Payer: PRIVATE HEALTH INSURANCE | Source: Ambulatory Visit | Attending: Family Medicine | Admitting: Family Medicine

## 2019-12-14 DIAGNOSIS — N6310 Unspecified lump in the right breast, unspecified quadrant: Secondary | ICD-10-CM

## 2020-01-01 ENCOUNTER — Other Ambulatory Visit: Payer: PRIVATE HEALTH INSURANCE

## 2020-02-26 ENCOUNTER — Encounter: Payer: Self-pay | Admitting: Family Medicine

## 2020-11-05 IMAGING — MG DIGITAL DIAGNOSTIC BILAT W/ TOMO W/ CAD
8 of 14 series · 8 of 40 positions shown · non-contrast
Comparison: None.

ACR Breast Density Category a: The breast tissue is almost entirely
fatty.

CLINICAL DATA: Male patient describing a RIGHT-sided breast lump
for 2 years, slowly increasing over time, more noticeable after
recent weight loss.

EXAM:
DIGITAL DIAGNOSTIC BILATERAL MAMMOGRAM WITH CAD AND TOMO
ULTRASOUND RIGHT BREAST

[L MLO synth-2D]
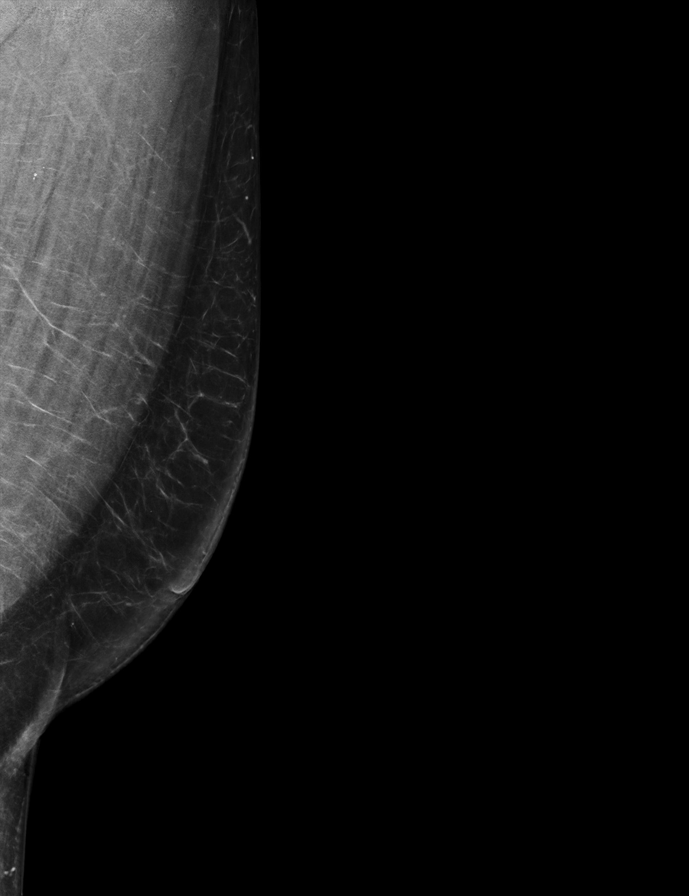

[R TAN synth-2D]
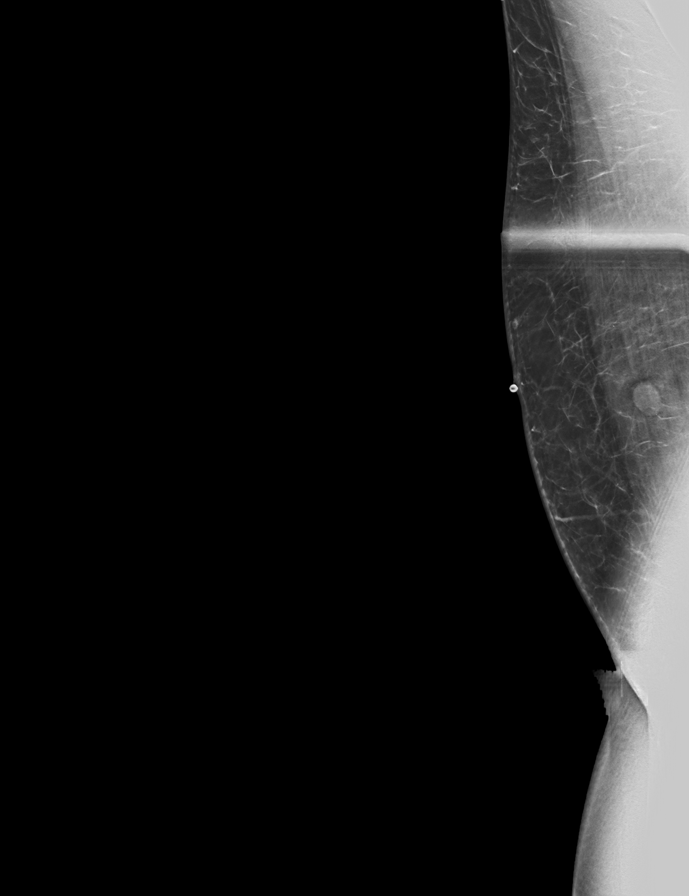

[R CC synth-2D (1 of 2)]
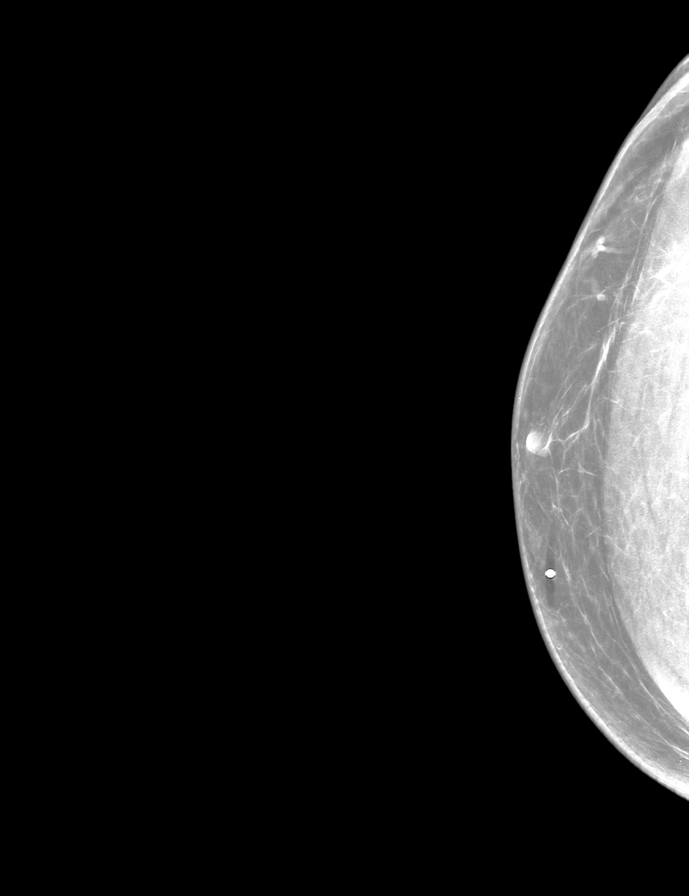

[L CC synth-2D (1 of 2)]
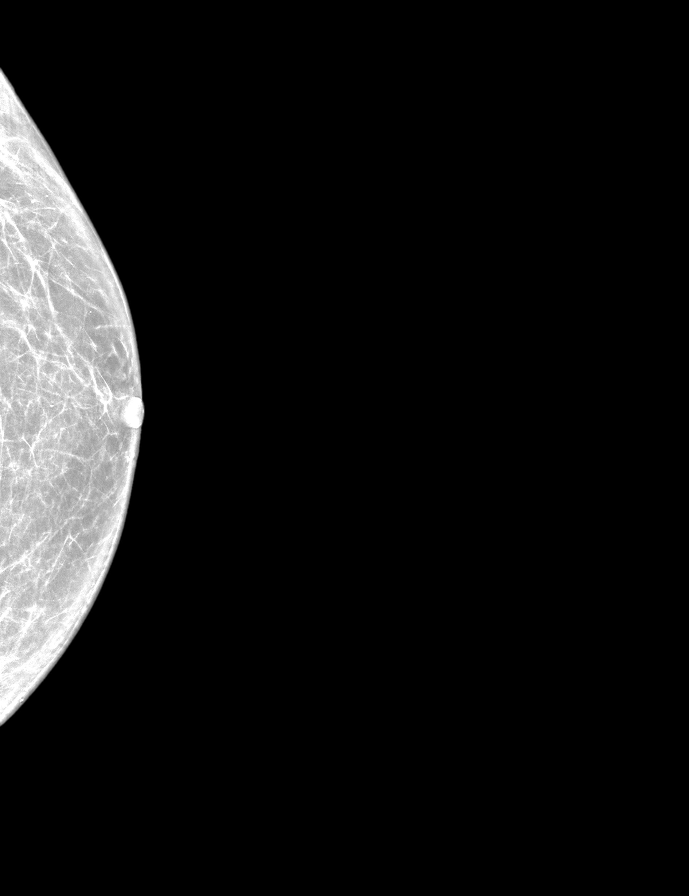

[R MLO synth-2D]
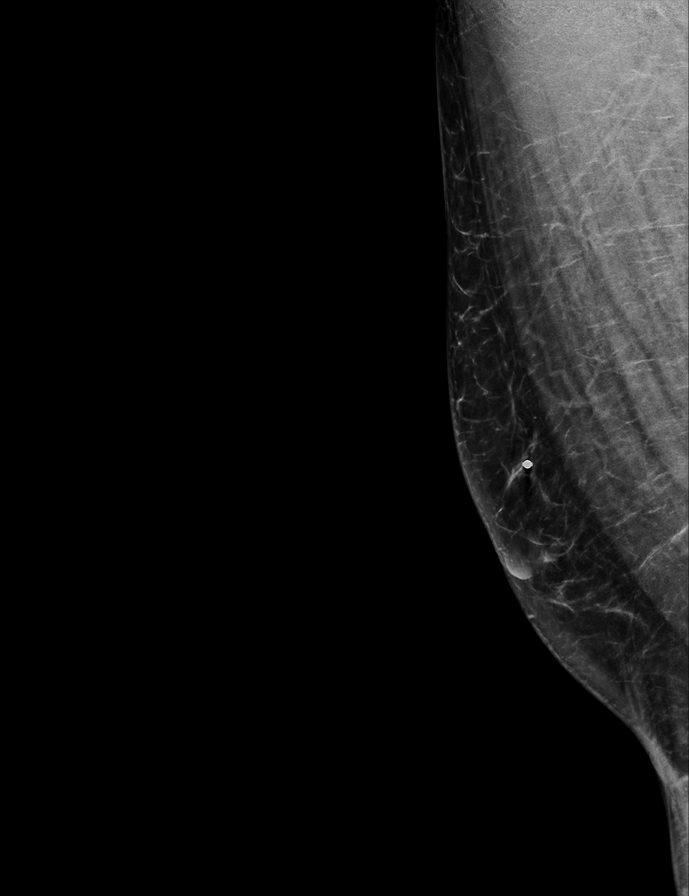

[R CC synth-2D (2 of 2)]
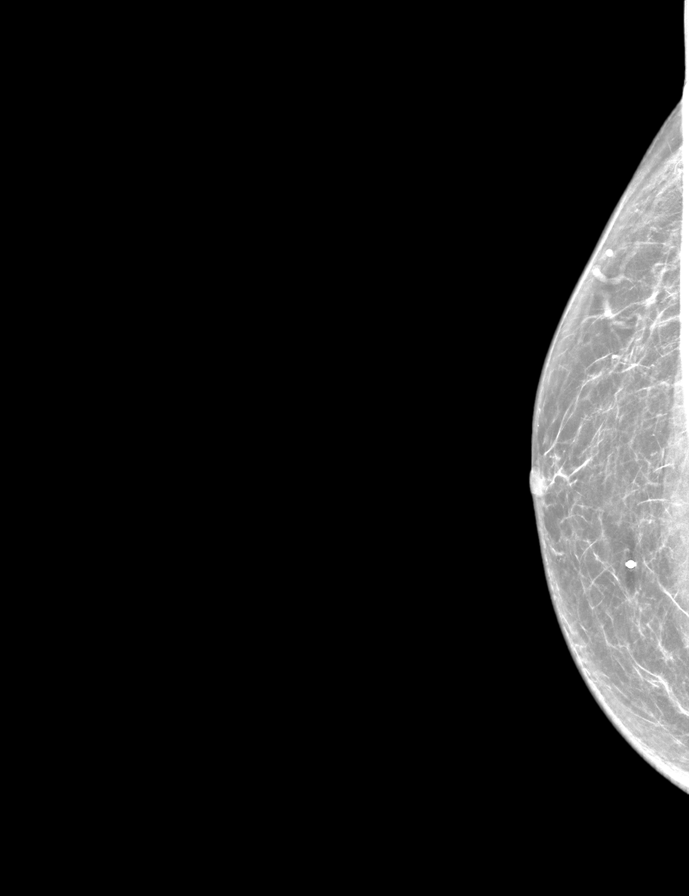

[L CC synth-2D (2 of 2)]
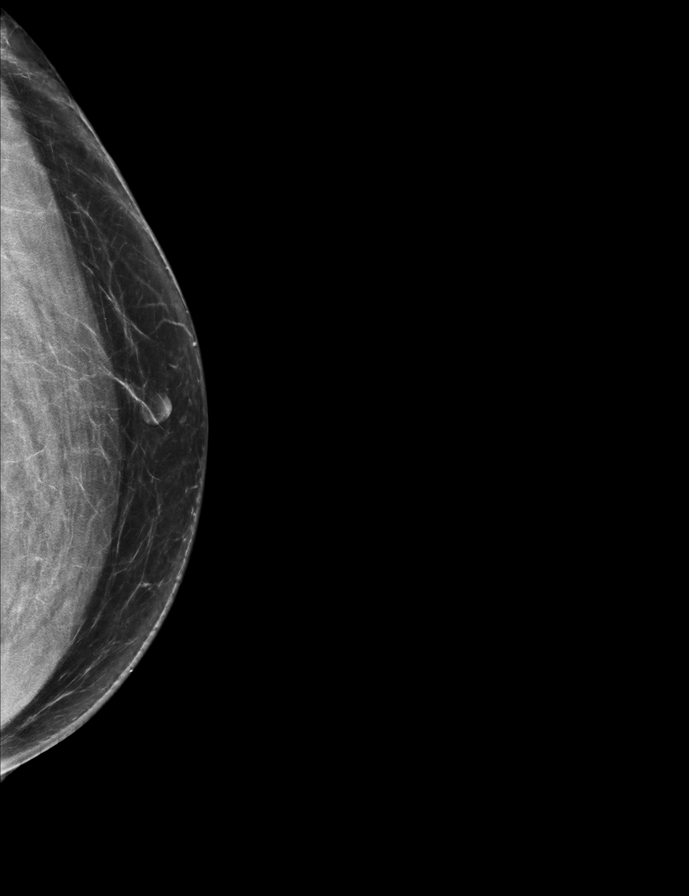

[L MLO tomo · tomo slice 41/80.0]
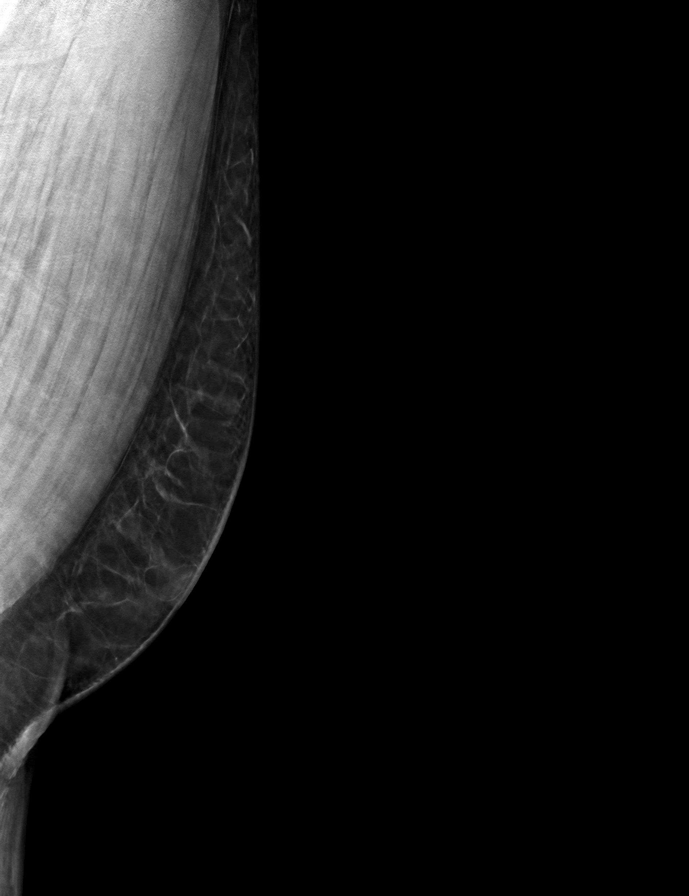

[8 of 40 positions shown; findings below may reference images not displayed]

FINDINGS: There are no suspicious masses, calcifications or secondary signs of
malignancy identified within either breast. Specifically, there is
no mammographic abnormality within the upper inner quadrant of the
RIGHT breast corresponding to the area of clinical concern, with
overlying skin marker in place.

Mammographic images were processed with CAD.

Targeted ultrasound is performed, evaluating the upper inner
quadrant of the RIGHT breast as directed by the patient, showing an
oval circumscribed echogenic mass in the RIGHT breast at the 2
o'clock axis, 5 cm from the nipple, measuring 1.7 cm, compatible
with lipoma.
IMPRESSION: 1. Benign lipoma within the RIGHT breast at the 2 o'clock axis,
measuring 1.7 cm, corresponding to the area of clinical concern.
2. No evidence of malignancy within either breast.

RECOMMENDATION:
1. Clinical follow-up for the benign lipoma.
2. Patient was informed that lipomas can enlarge and can sometimes
cause pain and, if this occurs, therapeutic surgical excision could
be performed.
3. The patient should return for additional imaging if the area that
he feels becomes larger and/or firmer to palpation, or if a new
palpable abnormality is identified in either breast.

I have discussed the findings and recommendations with the patient.
If applicable, a reminder letter will be sent to the patient
regarding the next appointment.

BI-RADS CATEGORY  2: Benign.

## 2020-11-11 ENCOUNTER — Encounter: Payer: Self-pay | Admitting: Family Medicine

## 2020-11-12 ENCOUNTER — Other Ambulatory Visit (INDEPENDENT_AMBULATORY_CARE_PROVIDER_SITE_OTHER): Payer: PRIVATE HEALTH INSURANCE

## 2020-11-12 ENCOUNTER — Other Ambulatory Visit: Payer: Self-pay

## 2020-11-12 DIAGNOSIS — Z131 Encounter for screening for diabetes mellitus: Secondary | ICD-10-CM

## 2020-11-12 DIAGNOSIS — R5383 Other fatigue: Secondary | ICD-10-CM | POA: Diagnosis not present

## 2020-11-12 DIAGNOSIS — E291 Testicular hypofunction: Secondary | ICD-10-CM

## 2020-11-12 DIAGNOSIS — Z79899 Other long term (current) drug therapy: Secondary | ICD-10-CM | POA: Diagnosis not present

## 2020-11-12 DIAGNOSIS — Z125 Encounter for screening for malignant neoplasm of prostate: Secondary | ICD-10-CM

## 2020-11-12 DIAGNOSIS — Z13220 Encounter for screening for lipoid disorders: Secondary | ICD-10-CM

## 2020-11-12 LAB — BASIC METABOLIC PANEL
BUN: 20 mg/dL (ref 6–23)
CO2: 28 mEq/L (ref 19–32)
Calcium: 9.3 mg/dL (ref 8.4–10.5)
Chloride: 105 mEq/L (ref 96–112)
Creatinine, Ser: 0.97 mg/dL (ref 0.40–1.50)
GFR: 87.22 mL/min (ref >60.00)
Glucose, Bld: 89 mg/dL (ref 70–99)
Potassium: 4.1 mEq/L (ref 3.5–5.1)
Sodium: 140 mEq/L (ref 135–145)

## 2020-11-12 LAB — HEPATIC FUNCTION PANEL
ALT: 32 U/L (ref 0–53)
AST: 24 U/L (ref 0–37)
Albumin: 4.3 g/dL (ref 3.5–5.2)
Alkaline Phosphatase: 80 U/L (ref 39–117)
Bilirubin, Direct: 0.1 mg/dL (ref 0.0–0.3)
Total Bilirubin: 0.7 mg/dL (ref 0.2–1.2)
Total Protein: 6.8 g/dL (ref 6.0–8.3)

## 2020-11-12 LAB — CBC WITH DIFFERENTIAL/PLATELET
Basophils Absolute: 0 10*3/uL (ref 0.0–0.1)
Basophils Relative: 0.6 % (ref 0.0–3.0)
Eosinophils Absolute: 0.2 10*3/uL (ref 0.0–0.7)
Eosinophils Relative: 5.5 % — ABNORMAL HIGH (ref 0.0–5.0)
HCT: 45.2 % (ref 39.0–52.0)
Hemoglobin: 15.4 g/dL (ref 13.0–17.0)
Lymphocytes Relative: 43.2 % (ref 12.0–46.0)
Lymphs Abs: 1.8 10*3/uL (ref 0.7–4.0)
MCHC: 34.1 g/dL (ref 30.0–36.0)
MCV: 94.5 fl (ref 78.0–100.0)
Monocytes Absolute: 0.4 10*3/uL (ref 0.1–1.0)
Monocytes Relative: 9.6 % (ref 3.0–12.0)
Neutro Abs: 1.7 10*3/uL (ref 1.4–7.7)
Neutrophils Relative %: 41.1 % — ABNORMAL LOW (ref 43.0–77.0)
Platelets: 206 10*3/uL (ref 150.0–400.0)
RBC: 4.78 Mil/uL (ref 4.22–5.81)
RDW: 12.6 % (ref 11.5–15.5)
WBC: 4.2 10*3/uL (ref 4.0–10.5)

## 2020-11-12 LAB — LIPID PANEL
Cholesterol: 136 mg/dL (ref 0–200)
HDL: 42.1 mg/dL (ref >39.00)
LDL Cholesterol: 80 mg/dL (ref 0–99)
NonHDL: 93.67
Total CHOL/HDL Ratio: 3
Triglycerides: 67 mg/dL (ref 0.0–149.0)
VLDL: 13.4 mg/dL (ref 0.0–40.0)

## 2020-11-12 LAB — HEMOGLOBIN A1C: Hgb A1c MFr Bld: 5.3 % (ref 4.6–6.5)

## 2020-11-12 LAB — TESTOSTERONE: Testosterone: 428.97 ng/dL (ref 300.00–890.00)

## 2020-11-13 LAB — PSA, TOTAL WITH REFLEX TO PSA, FREE: PSA, Total: 2.1 ng/mL (ref <=4.0)

## 2020-11-17 ENCOUNTER — Encounter: Payer: Self-pay | Admitting: Family Medicine

## 2020-11-17 NOTE — Progress Notes (Signed)
Camy Leder T. Fayne Mcguffee, MD, CAQ Sports Medicine  Primary Care and Sports Medicine [facility] at [facility] [address] [place], [address]  Phone: [phone]   FAX: [phone]  Alex Mack - 55 y.o. male   MRN [id number]   Date of Birth: 11-05-1965  Date: 11/18/2020   PCP: Hannah Beat, MD   Referral: Hannah Beat, MD  Chief Complaint  Patient presents with   Annual Exam    This visit occurred during the SARS-CoV-2 public health emergency.  Safety protocols were in place, including screening questions prior to the visit, additional usage of staff PPE, and extensive cleaning of exam room while observing appropriate contact time as indicated for disinfecting solutions.   Patient Care Team: Hannah Beat, MD as PCP - General (Family Medicine) Subjective:   Alex Mack is a 55 y.o. pleasant patient who presents with the following:  Preventative Health Maintenance Visit:  Health Maintenance Summary Reviewed and updated, unless pt declines services.  Tobacco History Reviewed. Alcohol: No concerns, no excessive use Exercise Habits: Very active and fit.  STD concerns: no risk or activity to increase risk Drug Use: None  He is a very nice gentleman, and I recall him very well. He presents today for general physical. He is quite active at baseline, and he works out almost daily.  Now doing some BJJ.  160/90 BP this morning Started M-Drive - T precursor  He has been doing a pretty in-depth squat program for the last 12 weeks, and he has decreased his cardio relatively.  His plan right now is to ramp up his aerobic activity and do some active rolling in jujitsu.  Health Maintenance  Topic Date Due   INFLUENZA VACCINE  01/16/2021 (Originally 05/19/2020)   COLONOSCOPY (Pts 45-58yrs Insurance coverage will need to be confirmed)  10/19/2024   TETANUS/TDAP  03/22/2029   COVID-19 Vaccine  Completed   Hepatitis C Screening  Completed    HIV Screening  Completed   Immunization History  Administered Date(s) Administered   Influenza,inj,Quad PF,6+ Mos 08/18/2017   PFIZER(Purple Top)SARS-COV-2 Vaccination 11/10/2019, 12/01/2019, 08/31/2020   Tdap 03/23/2019   Patient Active Problem List   Diagnosis Date Noted   Allergic rhinitis    History of kidney stones     Past Medical History:  Diagnosis Date   Allergic rhinitis    Basal cell carcinoma    History of kidney stones     Past Surgical History:  Procedure Laterality Date   ADENOIDECTOMY     HERNIA REPAIR     NASAL FRACTURE SURGERY      Family History  Problem Relation Age of Onset   Hypertension Father    Breast cancer Neg Hx     Past Medical History, Surgical History, Social History, Family History, Problem List, Medications, and Allergies have been reviewed and updated if relevant.  Review of Systems: Pertinent positives are listed above.  Otherwise, a full 14 point review of systems has been done in full and it is negative except where it is noted positive.  Objective:   BP (!) 160/90    Pulse 68    Temp 98.2 F (36.8 C) (Temporal)    Ht 5\' 9"  (1.753 m)    Wt 202 lb 12 oz (92 kg)    SpO2 97%    BMI 29.94 kg/m  Ideal Body Weight: Weight in (lb) to have BMI = 25: 168.9  Ideal Body Weight: Weight in (lb) to have BMI =  25: 168.9 No exam data present Depression screen [facility] 2/9 11/18/2020 03/23/2019 08/18/2017  Decreased Interest 0 0 0  Down, Depressed, Hopeless 0 0 0  PHQ - 2 Score 0 0 0     GEN: well developed, well nourished, no acute distress Eyes: conjunctiva and lids normal, PERRLA, EOMI ENT: TM clear, nares clear, oral exam WNL Neck: supple, no lymphadenopathy, no thyromegaly, no JVD Pulm: clear to auscultation and percussion, respiratory effort normal CV: regular rate and rhythm, S1-S2, no murmur, rub or gallop, no bruits, peripheral pulses normal and symmetric, no cyanosis, clubbing, edema or varicosities GI: soft,  non-tender; no hepatosplenomegaly, masses; active bowel sounds all quadrants GU: deferred Lymph: no cervical, axillary or inguinal adenopathy MSK: gait normal, muscle tone and strength WNL, no joint swelling, effusions, discoloration, crepitus  SKIN: clear, good turgor, color WNL, no rashes, lesions, or ulcerations Neuro: normal mental status, normal strength, sensation, and motion Psych: alert; oriented to person, place and time, normally interactive and not anxious or depressed in appearance.  All labs reviewed with patient. Results for orders placed or performed in visit on 11/12/20  Lipid panel  Result Value Ref Range   Cholesterol 136 0 - 200 mg/dL   Triglycerides 67.0 0.0 - 149.0 mg/dL   HDL 42.10 >39.00 mg/dL   VLDL 13.4 0.0 - 40.0 mg/dL   LDL Cholesterol 80 0 - 99 mg/dL   Total CHOL/HDL Ratio 3    NonHDL 93.67   Hepatic function panel  Result Value Ref Range   Total Bilirubin 0.7 0.2 - 1.2 mg/dL   Bilirubin, Direct 0.1 0.0 - 0.3 mg/dL   Alkaline Phosphatase 80 39 - 117 U/L   AST 24 0 - 37 U/L   ALT 32 0 - 53 U/L   Total Protein 6.8 6.0 - 8.3 g/dL   Albumin 4.3 3.5 - 5.2 g/dL  Basic metabolic panel  Result Value Ref Range   Sodium 140 135 - 145 mEq/L   Potassium 4.1 3.5 - 5.1 mEq/L   Chloride 105 96 - 112 mEq/L   CO2 28 19 - 32 mEq/L   Glucose, Bld 89 70 - 99 mg/dL   BUN 20 6 - 23 mg/dL   Creatinine, Ser 0.97 0.40 - 1.50 mg/dL   GFR 87.22 >60.00 mL/min   Calcium 9.3 8.4 - 10.5 mg/dL  CBC with Differential/Platelet  Result Value Ref Range   WBC 4.2 4.0 - 10.5 K/uL   RBC 4.78 4.22 - 5.81 Mil/uL   Hemoglobin 15.4 13.0 - 17.0 g/dL   HCT 45.2 39.0 - 52.0 %   MCV 94.5 78.0 - 100.0 fl   MCHC 34.1 30.0 - 36.0 g/dL   RDW 12.6 11.5 - 15.5 %   Platelets 206.0 150.0 - 400.0 K/uL   Neutrophils Relative % 41.1 (L) 43.0 - 77.0 %   Lymphocytes Relative 43.2 12.0 - 46.0 %   Monocytes Relative 9.6 3.0 - 12.0 %   Eosinophils Relative 5.5 (H) 0.0 - 5.0 %   Basophils Relative  0.6 0.0 - 3.0 %   Neutro Abs 1.7 1.4 - 7.7 K/uL   Lymphs Abs 1.8 0.7 - 4.0 K/uL   Monocytes Absolute 0.4 0.1 - 1.0 K/uL   Eosinophils Absolute 0.2 0.0 - 0.7 K/uL   Basophils Absolute 0.0 0.0 - 0.1 K/uL  Hemoglobin A1c  Result Value Ref Range   Hgb A1c MFr Bld 5.3 4.6 - 6.5 %  PSA, Total with Reflex to PSA, Free  Result Value Ref Range  PSA, Total 2.1 < OR = 4.0 ng/mL  Testosterone  Result Value Ref Range   Testosterone 428.97 300.00 - 890.00 ng/dL    Assessment and Plan:     ICD-10-CM   1. Encounter for health maintenance examination with abnormal findings  Z00.01    He will let me know about his blood pressure, but aside from this I have no significant concerns.  Up-to-date on all health maintenance screening.  Patient Instructions  Monitor blood pressure twice a week for 2 months, then call or mychart if above 140/90.    Health Maintenance Exam: The patient's preventative maintenance and recommended screening tests for an annual wellness exam were reviewed in full today. Brought up to date unless services declined.  Counselled on the importance of diet, exercise, and its role in overall health and mortality. The patient's FH and SH was reviewed, including their home life, tobacco status, and drug and alcohol status.  Follow-up in 1 year for physical exam or additional follow-up below.  Follow-up: No follow-ups on file. Or follow-up in 1 year if not noted.  No orders of the defined types were placed in this encounter.  There are no discontinued medications. No orders of the defined types were placed in this encounter.   Signed,  Maud Deed. Makael Stein, MD   Allergies as of 11/18/2020   No Known Allergies     Medication List       Accurate as of November 18, 2020  8:57 AM. If you have any questions, ask your nurse or doctor.        aspirin 81 MG EC tablet Take 81 mg by mouth daily. Swallow whole.   CREATINE PO Take by mouth.

## 2020-11-18 ENCOUNTER — Encounter: Payer: Self-pay | Admitting: Family Medicine

## 2020-11-18 ENCOUNTER — Ambulatory Visit (INDEPENDENT_AMBULATORY_CARE_PROVIDER_SITE_OTHER): Payer: PRIVATE HEALTH INSURANCE | Admitting: Family Medicine

## 2020-11-18 ENCOUNTER — Other Ambulatory Visit: Payer: Self-pay

## 2020-11-18 VITALS — BP 160/90 | HR 68 | Temp 98.2°F | Ht 69.0 in | Wt 202.8 lb

## 2020-11-18 DIAGNOSIS — Z0000 Encounter for general adult medical examination without abnormal findings: Secondary | ICD-10-CM

## 2020-11-18 DIAGNOSIS — Z0001 Encounter for general adult medical examination with abnormal findings: Secondary | ICD-10-CM

## 2020-11-18 NOTE — Patient Instructions (Signed)
Monitor blood pressure twice a week for 2 months, then call or mychart if above 140/90.

## 2021-11-03 ENCOUNTER — Other Ambulatory Visit: Payer: Self-pay | Admitting: Family Medicine

## 2021-11-03 DIAGNOSIS — Z131 Encounter for screening for diabetes mellitus: Secondary | ICD-10-CM

## 2021-11-03 DIAGNOSIS — Z13220 Encounter for screening for lipoid disorders: Secondary | ICD-10-CM

## 2021-11-03 DIAGNOSIS — E291 Testicular hypofunction: Secondary | ICD-10-CM

## 2021-11-03 DIAGNOSIS — Z79899 Other long term (current) drug therapy: Secondary | ICD-10-CM

## 2021-11-03 DIAGNOSIS — Z125 Encounter for screening for malignant neoplasm of prostate: Secondary | ICD-10-CM

## 2021-11-03 DIAGNOSIS — R6882 Decreased libido: Secondary | ICD-10-CM

## 2021-11-12 ENCOUNTER — Other Ambulatory Visit (INDEPENDENT_AMBULATORY_CARE_PROVIDER_SITE_OTHER): Payer: No Typology Code available for payment source

## 2021-11-12 ENCOUNTER — Other Ambulatory Visit: Payer: PRIVATE HEALTH INSURANCE

## 2021-11-12 DIAGNOSIS — R6882 Decreased libido: Secondary | ICD-10-CM

## 2021-11-12 DIAGNOSIS — Z125 Encounter for screening for malignant neoplasm of prostate: Secondary | ICD-10-CM

## 2021-11-12 DIAGNOSIS — Z13220 Encounter for screening for lipoid disorders: Secondary | ICD-10-CM

## 2021-11-12 DIAGNOSIS — Z131 Encounter for screening for diabetes mellitus: Secondary | ICD-10-CM | POA: Diagnosis not present

## 2021-11-12 DIAGNOSIS — Z79899 Other long term (current) drug therapy: Secondary | ICD-10-CM

## 2021-11-12 DIAGNOSIS — E291 Testicular hypofunction: Secondary | ICD-10-CM

## 2021-11-12 LAB — CBC WITH DIFFERENTIAL/PLATELET
Basophils Absolute: 0 10*3/uL (ref 0.0–0.1)
Basophils Relative: 0.8 % (ref 0.0–3.0)
Eosinophils Absolute: 0.3 10*3/uL (ref 0.0–0.7)
Eosinophils Relative: 6.7 % — ABNORMAL HIGH (ref 0.0–5.0)
HCT: 48.7 % (ref 39.0–52.0)
Hemoglobin: 16.4 g/dL (ref 13.0–17.0)
Lymphocytes Relative: 38.9 % (ref 12.0–46.0)
Lymphs Abs: 1.9 10*3/uL (ref 0.7–4.0)
MCHC: 33.7 g/dL (ref 30.0–36.0)
MCV: 94.5 fl (ref 78.0–100.0)
Monocytes Absolute: 0.4 10*3/uL (ref 0.1–1.0)
Monocytes Relative: 8.2 % (ref 3.0–12.0)
Neutro Abs: 2.2 10*3/uL (ref 1.4–7.7)
Neutrophils Relative %: 45.4 % (ref 43.0–77.0)
Platelets: 217 10*3/uL (ref 150.0–400.0)
RBC: 5.15 Mil/uL (ref 4.22–5.81)
RDW: 12.9 % (ref 11.5–15.5)
WBC: 4.8 10*3/uL (ref 4.0–10.5)

## 2021-11-12 LAB — HEPATIC FUNCTION PANEL
ALT: 33 U/L (ref 0–53)
AST: 26 U/L (ref 0–37)
Albumin: 4.4 g/dL (ref 3.5–5.2)
Alkaline Phosphatase: 80 U/L (ref 39–117)
Bilirubin, Direct: 0.1 mg/dL (ref 0.0–0.3)
Total Bilirubin: 0.6 mg/dL (ref 0.2–1.2)
Total Protein: 7.4 g/dL (ref 6.0–8.3)

## 2021-11-12 LAB — BASIC METABOLIC PANEL
BUN: 17 mg/dL (ref 6–23)
CO2: 29 mEq/L (ref 19–32)
Calcium: 9.3 mg/dL (ref 8.4–10.5)
Chloride: 105 mEq/L (ref 96–112)
Creatinine, Ser: 0.95 mg/dL (ref 0.40–1.50)
GFR: 88.8 mL/min (ref >60.00)
Glucose, Bld: 86 mg/dL (ref 70–99)
Potassium: 4.9 mEq/L (ref 3.5–5.1)
Sodium: 141 mEq/L (ref 135–145)

## 2021-11-12 LAB — HEMOGLOBIN A1C: Hgb A1c MFr Bld: 5.2 % (ref 4.6–6.5)

## 2021-11-12 LAB — LIPID PANEL
Cholesterol: 139 mg/dL (ref 0–200)
HDL: 44.3 mg/dL (ref >39.00)
LDL Cholesterol: 82 mg/dL (ref 0–99)
NonHDL: 94.93
Total CHOL/HDL Ratio: 3
Triglycerides: 63 mg/dL (ref 0.0–149.0)
VLDL: 12.6 mg/dL (ref 0.0–40.0)

## 2021-11-16 ENCOUNTER — Encounter: Payer: Self-pay | Admitting: Family Medicine

## 2021-11-17 LAB — TESTOS,TOTAL,FREE AND SHBG (FEMALE)
Free Testosterone: 71.8 pg/mL (ref 35.0–155.0)
Sex Hormone Binding: 53 nmol/L (ref 22–77)
Testosterone, Total, LC-MS-MS: 598 ng/dL (ref 250–1100)

## 2021-11-17 LAB — PSA, TOTAL WITH REFLEX TO PSA, FREE: PSA, Total: 1.2 ng/mL (ref <=4.0)

## 2021-11-19 ENCOUNTER — Encounter: Payer: Self-pay | Admitting: Family Medicine

## 2021-11-19 ENCOUNTER — Other Ambulatory Visit: Payer: Self-pay

## 2021-11-19 ENCOUNTER — Ambulatory Visit (INDEPENDENT_AMBULATORY_CARE_PROVIDER_SITE_OTHER): Payer: No Typology Code available for payment source | Admitting: Family Medicine

## 2021-11-19 VITALS — BP 150/84 | HR 63 | Temp 98.0°F | Ht 69.0 in | Wt 185.4 lb

## 2021-11-19 DIAGNOSIS — Z23 Encounter for immunization: Secondary | ICD-10-CM | POA: Diagnosis not present

## 2021-11-19 DIAGNOSIS — Z0000 Encounter for general adult medical examination without abnormal findings: Secondary | ICD-10-CM | POA: Diagnosis not present

## 2021-11-19 NOTE — Progress Notes (Signed)
Alex Belt T. Jonne Rote, MD, [place] at [facility] [place], [address]  Phone: [phone]   FAX: [phone]  Alex Mack - 56 y.o. male   MRN [id number]   Date of Birth: 1965-10-29  Date: 11/19/2021   PCP: Owens Loffler, MD   Referral: Owens Loffler, MD  Chief Complaint  Patient presents with   Annual Exam    This visit occurred during the SARS-CoV-2 public health emergency.  Safety protocols were in place, including screening questions prior to the visit, additional usage of staff PPE, and extensive cleaning of exam room while observing appropriate contact time as indicated for disinfecting solutions.   Patient Care Team: Owens Loffler, MD as PCP - General (Family Medicine) Subjective:   Alex Mack is a 56 y.o. pleasant patient who presents with the following:  Preventative Health Maintenance Visit:  Health Maintenance Summary Reviewed and updated, unless pt declines services.  Only Jiujitsu.  No smoking No sig ETOH No drugs  Did have a disc bulge A lot of weakness  Shingrix - will do today  Will get Covid booster  BP was a little bit high. Checks at home sometimes. - Has lost a lot of weight, 15 pounds    Health Maintenance  Topic Date Due   Zoster Vaccines- Shingrix (1 of 2) Never done   COVID-19 Vaccine (4 - Booster for Pfizer series) 10/26/2020   COLONOSCOPY (Pts 45-64yrs Insurance coverage will need to be confirmed)  10/19/2024   TETANUS/TDAP  03/22/2029   INFLUENZA VACCINE  Completed   Hepatitis C Screening  Completed   HIV Screening  Completed   HPV VACCINES  Aged Out   Immunization History  Administered Date(s) Administered   Influenza,inj,Quad PF,6+ Mos 08/18/2017   Influenza-Unspecified 08/13/2021   PFIZER(Purple Top)SARS-COV-2 Vaccination 11/10/2019, 12/01/2019, 08/31/2020   Tdap 03/23/2019   Patient Active Problem List   Diagnosis Date Noted   Allergic rhinitis     History of kidney stones     Past Medical History:  Diagnosis Date   Allergic rhinitis    Basal cell carcinoma    History of kidney stones     Past Surgical History:  Procedure Laterality Date   ADENOIDECTOMY     HERNIA REPAIR     NASAL FRACTURE SURGERY      Family History  Problem Relation Age of Onset   Hypertension Father    Breast cancer Neg Hx     Past Medical History, Surgical History, Social History, Family History, Problem List, Medications, and Allergies have been reviewed and updated if relevant.  Review of Systems: Pertinent positives are listed above.  Otherwise, a full 14 point review of systems has been done in full and it is negative except where it is noted positive.  Objective:   BP (!) 150/84    Pulse 63    Temp 98 F (36.7 C) (Temporal)    Ht 5\' 9"  (1.753 m)    Wt 185 lb 6 oz (84.1 kg)    SpO2 96%    BMI 27.38 kg/m  Ideal Body Weight: Weight in (lb) to have BMI = 25: 168.9  Ideal Body Weight: Weight in (lb) to have BMI = 25: 168.9 No results found. Depression screen [facility] 2/9 11/19/2021 11/18/2020 03/23/2019 08/18/2017  Decreased Interest 0 0 0 0  Down, Depressed, Hopeless 0 0 0 0  PHQ - 2 Score 0 0 0 0     GEN: well developed,  well nourished, no acute distress Eyes: conjunctiva and lids normal, PERRLA, EOMI ENT: TM clear, nares clear, oral exam WNL Neck: supple, no lymphadenopathy, no thyromegaly, no JVD Pulm: clear to auscultation and percussion, respiratory effort normal CV: regular rate and rhythm, S1-S2, no murmur, rub or gallop, no bruits, peripheral pulses normal and symmetric, no cyanosis, clubbing, edema or varicosities GI: soft, non-tender; no hepatosplenomegaly, masses; active bowel sounds all quadrants GU: deferred Lymph: no cervical, axillary or inguinal adenopathy MSK: gait normal, muscle tone and strength WNL, no joint swelling, effusions, discoloration, crepitus  SKIN: clear, good turgor, color WNL, no rashes, lesions, or  ulcerations Neuro: normal mental status, normal strength, sensation, and motion Psych: alert; oriented to person, place and time, normally interactive and not anxious or depressed in appearance.  All labs reviewed with patient. Results for orders placed or performed in visit on 11/12/21  PSA, Total with Reflex to PSA, Free  Result Value Ref Range   PSA, Total 1.2 < OR = 4.0 ng/mL  Testos,Total,Free and SHBG (Male)  Result Value Ref Range   Testosterone, Total, LC-MS-MS 598 250 - 1,100 ng/dL   Free Testosterone 71.8 35.0 - 155.0 pg/mL   Sex Hormone Binding 53 22 - 77 nmol/L  Hemoglobin A1c  Result Value Ref Range   Hgb A1c MFr Bld 5.2 4.6 - 6.5 %  CBC with Differential/Platelet  Result Value Ref Range   WBC 4.8 4.0 - 10.5 K/uL   RBC 5.15 4.22 - 5.81 Mil/uL   Hemoglobin 16.4 13.0 - 17.0 g/dL   HCT 48.7 39.0 - 52.0 %   MCV 94.5 78.0 - 100.0 fl   MCHC 33.7 30.0 - 36.0 g/dL   RDW 12.9 11.5 - 15.5 %   Platelets 217.0 150.0 - 400.0 K/uL   Neutrophils Relative % 45.4 43.0 - 77.0 %   Lymphocytes Relative 38.9 12.0 - 46.0 %   Monocytes Relative 8.2 3.0 - 12.0 %   Eosinophils Relative 6.7 (H) 0.0 - 5.0 %   Basophils Relative 0.8 0.0 - 3.0 %   Neutro Abs 2.2 1.4 - 7.7 K/uL   Lymphs Abs 1.9 0.7 - 4.0 K/uL   Monocytes Absolute 0.4 0.1 - 1.0 K/uL   Eosinophils Absolute 0.3 0.0 - 0.7 K/uL   Basophils Absolute 0.0 0.0 - 0.1 K/uL  Basic metabolic panel  Result Value Ref Range   Sodium 141 135 - 145 mEq/L   Potassium 4.9 3.5 - 5.1 mEq/L   Chloride 105 96 - 112 mEq/L   CO2 29 19 - 32 mEq/L   Glucose, Bld 86 70 - 99 mg/dL   BUN 17 6 - 23 mg/dL   Creatinine, Ser 0.95 0.40 - 1.50 mg/dL   GFR 88.80 >60.00 mL/min   Calcium 9.3 8.4 - 10.5 mg/dL  Hepatic function panel  Result Value Ref Range   Total Bilirubin 0.6 0.2 - 1.2 mg/dL   Bilirubin, Direct 0.1 0.0 - 0.3 mg/dL   Alkaline Phosphatase 80 39 - 117 U/L   AST 26 0 - 37 U/L   ALT 33 0 - 53 U/L   Total Protein 7.4 6.0 - 8.3 g/dL    Albumin 4.4 3.5 - 5.2 g/dL  Lipid panel  Result Value Ref Range   Cholesterol 139 0 - 200 mg/dL   Triglycerides 63.0 0.0 - 149.0 mg/dL   HDL 44.30 >39.00 mg/dL   VLDL 12.6 0.0 - 40.0 mg/dL   LDL Cholesterol 82 0 - 99 mg/dL   Total CHOL/HDL Ratio 3  NonHDL 94.93     Assessment and Plan:     ICD-10-CM   1. Healthcare maintenance  Z00.00      Globally, he is doing well. Shingrix No. 1 today.  Blood pressure is up somewhat.  He has lost 15 pounds, and he has changed his workouts up a lot.  I think that it makes some sense to track this over the next few months, but low threshold if it remains high to initiate medical treatment.  Otherwise, do not have any significant concerns.  Health Maintenance Exam: The patient's preventative maintenance and recommended screening tests for an annual wellness exam were reviewed in full today. Brought up to date unless services declined.  Counselled on the importance of diet, exercise, and its role in overall health and mortality. The patient's FH and SH was reviewed, including their home life, tobacco status, and drug and alcohol status.  Follow-up in 1 year for physical exam or additional follow-up below.  Follow-up: No follow-ups on file. Or follow-up in 1 year if not noted.  No orders of the defined types were placed in this encounter.  Medications Discontinued During This Encounter  Medication Reason   CREATINE PO Patient Preference   No orders of the defined types were placed in this encounter.   Signed,  Maud Deed. Nussen Pullin, MD   Allergies as of 11/19/2021   No Known Allergies      Medication List        Accurate as of November 19, 2021  9:04 AM. If you have any questions, ask your nurse or doctor.          STOP taking these medications    CREATINE PO Stopped by: Owens Loffler, MD       TAKE these medications    aspirin 81 MG EC tablet Take 81 mg by mouth daily. Swallow whole.

## 2021-11-19 NOTE — Addendum Note (Signed)
Addended by: Carter Kitten on: 11/19/2021 09:12 AM   Modules accepted: Orders

## 2022-02-17 ENCOUNTER — Ambulatory Visit (INDEPENDENT_AMBULATORY_CARE_PROVIDER_SITE_OTHER): Payer: No Typology Code available for payment source | Admitting: *Deleted

## 2022-02-17 DIAGNOSIS — Z23 Encounter for immunization: Secondary | ICD-10-CM | POA: Diagnosis not present

## 2022-02-17 NOTE — Progress Notes (Signed)
Per orders of Dr. Diona Browner, injection of 2nd Shingrix given by Lauralyn Primes. ?Patient tolerated injection well.  ?

## 2022-11-16 ENCOUNTER — Other Ambulatory Visit: Payer: Self-pay | Admitting: Family Medicine

## 2022-11-16 DIAGNOSIS — Z13220 Encounter for screening for lipoid disorders: Secondary | ICD-10-CM

## 2022-11-16 DIAGNOSIS — Z131 Encounter for screening for diabetes mellitus: Secondary | ICD-10-CM

## 2022-11-16 DIAGNOSIS — R5383 Other fatigue: Secondary | ICD-10-CM

## 2022-11-16 DIAGNOSIS — Z125 Encounter for screening for malignant neoplasm of prostate: Secondary | ICD-10-CM

## 2022-11-16 DIAGNOSIS — R861 Abnormal level of hormones in specimens from male genital organs: Secondary | ICD-10-CM

## 2022-11-17 ENCOUNTER — Other Ambulatory Visit (INDEPENDENT_AMBULATORY_CARE_PROVIDER_SITE_OTHER): Payer: No Typology Code available for payment source

## 2022-11-17 DIAGNOSIS — Z131 Encounter for screening for diabetes mellitus: Secondary | ICD-10-CM

## 2022-11-17 DIAGNOSIS — Z13220 Encounter for screening for lipoid disorders: Secondary | ICD-10-CM

## 2022-11-17 DIAGNOSIS — R861 Abnormal level of hormones in specimens from male genital organs: Secondary | ICD-10-CM | POA: Diagnosis not present

## 2022-11-17 DIAGNOSIS — R5383 Other fatigue: Secondary | ICD-10-CM

## 2022-11-17 DIAGNOSIS — Z125 Encounter for screening for malignant neoplasm of prostate: Secondary | ICD-10-CM

## 2022-11-17 LAB — CBC WITH DIFFERENTIAL/PLATELET
Basophils Absolute: 0 10*3/uL (ref 0.0–0.1)
Basophils Relative: 0.7 % (ref 0.0–3.0)
Eosinophils Absolute: 0.3 10*3/uL (ref 0.0–0.7)
Eosinophils Relative: 6.1 % — ABNORMAL HIGH (ref 0.0–5.0)
HCT: 46.8 % (ref 39.0–52.0)
Hemoglobin: 16.2 g/dL (ref 13.0–17.0)
Lymphocytes Relative: 40 % (ref 12.0–46.0)
Lymphs Abs: 2.2 10*3/uL (ref 0.7–4.0)
MCHC: 34.6 g/dL (ref 30.0–36.0)
MCV: 92.7 fl (ref 78.0–100.0)
Monocytes Absolute: 0.5 10*3/uL (ref 0.1–1.0)
Monocytes Relative: 8.7 % (ref 3.0–12.0)
Neutro Abs: 2.5 10*3/uL (ref 1.4–7.7)
Neutrophils Relative %: 44.5 % (ref 43.0–77.0)
Platelets: 206 10*3/uL (ref 150.0–400.0)
RBC: 5.05 Mil/uL (ref 4.22–5.81)
RDW: 13.4 % (ref 11.5–15.5)
WBC: 5.6 10*3/uL (ref 4.0–10.5)

## 2022-11-17 LAB — LIPID PANEL
Cholesterol: 152 mg/dL (ref 0–200)
HDL: 44.4 mg/dL (ref >39.00)
LDL Cholesterol: 96 mg/dL (ref 0–99)
NonHDL: 107.64
Total CHOL/HDL Ratio: 3
Triglycerides: 58 mg/dL (ref 0.0–149.0)
VLDL: 11.6 mg/dL (ref 0.0–40.0)

## 2022-11-17 LAB — HEPATIC FUNCTION PANEL
ALT: 42 U/L (ref 0–53)
AST: 30 U/L (ref 0–37)
Albumin: 4.2 g/dL (ref 3.5–5.2)
Alkaline Phosphatase: 79 U/L (ref 39–117)
Bilirubin, Direct: 0.1 mg/dL (ref 0.0–0.3)
Total Bilirubin: 0.5 mg/dL (ref 0.2–1.2)
Total Protein: 7.1 g/dL (ref 6.0–8.3)

## 2022-11-17 LAB — BASIC METABOLIC PANEL
BUN: 18 mg/dL (ref 6–23)
CO2: 30 mEq/L (ref 19–32)
Calcium: 9.1 mg/dL (ref 8.4–10.5)
Chloride: 105 mEq/L (ref 96–112)
Creatinine, Ser: 1.1 mg/dL (ref 0.40–1.50)
GFR: 73.95 mL/min (ref >60.00)
Glucose, Bld: 98 mg/dL (ref 70–99)
Potassium: 4.1 mEq/L (ref 3.5–5.1)
Sodium: 141 mEq/L (ref 135–145)

## 2022-11-17 LAB — HEMOGLOBIN A1C: Hgb A1c MFr Bld: 5.5 % (ref 4.6–6.5)

## 2022-11-22 ENCOUNTER — Encounter: Payer: Self-pay | Admitting: Family Medicine

## 2022-11-22 LAB — TESTOS,TOTAL,FREE AND SHBG (FEMALE)
Free Testosterone: 69.5 pg/mL (ref 35.0–155.0)
Sex Hormone Binding: 50 nmol/L (ref 22–77)
Testosterone, Total, LC-MS-MS: 521 ng/dL (ref 250–1100)

## 2022-11-22 LAB — PSA, TOTAL WITH REFLEX TO PSA, FREE: PSA, Total: 1.2 ng/mL (ref <=4.0)

## 2022-11-22 NOTE — Progress Notes (Unsigned)
Alex Pichon T. Tajee Savant, MD, [place] [facility] [place], [address]  Phone: [phone]  FAX: [phone]  Alex Mack - 57 y.o. male  MRN [id number]  Date of Birth: 1966-10-10  Date: 11/23/2022  PCP: Owens Loffler, MD  Referral: Owens Loffler, MD  No chief complaint on file.  Patient Care Team: Owens Loffler, MD as PCP - General (Family Medicine) Subjective:   Alex Mack is a 57 y.o. pleasant patient who presents with the following:  Preventative Health Maintenance Visit:  Health Maintenance Summary Reviewed and updated, unless pt declines services.  Tobacco History Reviewed. Alcohol: No concerns, no excessive use Exercise Habits: Some activity, rec at least 30 mins 5 times a week STD concerns: no risk or activity to increase risk Drug Use: None  Covid booster    Health Maintenance  Topic Date Due   INFLUENZA VACCINE  05/19/2022   COVID-19 Vaccine (4 - 2023-24 season) 06/19/2022   COLONOSCOPY (Pts 45-45yr Insurance coverage will need to be confirmed)  10/19/2024   DTaP/Tdap/Td (2 - Td or Tdap) 03/22/2029   Hepatitis C Screening  Completed   HIV Screening  Completed   Zoster Vaccines- Shingrix  Completed   HPV VACCINES  Aged Out   Immunization History  Administered Date(s) Administered   Influenza,inj,Quad PF,6+ Mos 08/18/2017   Influenza-Unspecified 08/13/2021   PFIZER(Purple Top)SARS-COV-2 Vaccination 11/10/2019, 12/01/2019, 08/31/2020   Tdap 03/23/2019   Zoster Recombinat (Shingrix) 11/19/2021, 02/17/2022   Patient Active Problem List   Diagnosis Date Noted   Allergic rhinitis    History of kidney stones     Past Medical History:  Diagnosis Date   Allergic rhinitis    Basal cell carcinoma    History of kidney stones     Past Surgical History:  Procedure Laterality Date   ADENOIDECTOMY     HERNIA REPAIR     NASAL FRACTURE SURGERY      Family History  Problem  Relation Age of Onset   Hypertension Father    Breast cancer Neg Hx     Social History   Social History Narrative   Physical Therapist, [employer]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 (OLeisure centre manager   Some MMA, [place] in the past    Past Medical History, Surgical History, Social History, Family History, Problem List, Medications, and Allergies have been reviewed and updated if relevant.  Review of Systems: Pertinent positives are listed above.  Otherwise, a full 14 point review of systems has been done in full and it is negative except where it is noted positive.  Objective:   There were no vitals taken for this visit. Ideal Body Weight:    Ideal Body Weight:   No results found.    11/19/2021    8:19 AM 11/18/2020    8:26 AM 03/23/2019    8:34 AM 08/18/2017    8:26 AM  Depression screen PHQ 2/9  Decreased Interest 0 0 0 0  Down, Depressed, Hopeless 0 0 0 0  PHQ - 2 Score 0 0 0 0     GEN: well developed, well nourished, no acute distress Eyes: conjunctiva and lids normal, PERRLA, EOMI ENT: TM clear, nares clear, oral exam WNL Neck: supple, no lymphadenopathy, no thyromegaly, no JVD Pulm: clear to auscultation and percussion, respiratory effort normal CV: regular rate and rhythm, S1-S2, no murmur, rub or gallop, no bruits, peripheral pulses normal and symmetric, no cyanosis, clubbing,  edema or varicosities GI: soft, non-tender; no hepatosplenomegaly, masses; active bowel sounds all quadrants GU: deferred Lymph: no cervical, axillary or inguinal adenopathy MSK: gait normal, muscle tone and strength WNL, no joint swelling, effusions, discoloration, crepitus  SKIN: clear, good turgor, color WNL, no rashes, lesions, or ulcerations Neuro: normal mental status, normal strength, sensation, and motion Psych: alert; oriented to person, place and time, normally interactive and not anxious or depressed in appearance.  All labs reviewed with patient. Results  for orders placed or performed in visit on 11/17/22  Testos,Total,Free and SHBG (Male)  Result Value Ref Range   Testosterone, Total, LC-MS-MS 521 250 - 1,100 ng/dL   Free Testosterone 69.5 35.0 - 155.0 pg/mL   Sex Hormone Binding 50.0 22 - 77 nmol/L  PSA, Total with Reflex to PSA, Free  Result Value Ref Range   PSA, Total 1.2 < OR = 4.0 ng/mL  Lipid panel  Result Value Ref Range   Cholesterol 152 0 - 200 mg/dL   Triglycerides 58.0 0.0 - 149.0 mg/dL   HDL 44.40 >39.00 mg/dL   VLDL 11.6 0.0 - 40.0 mg/dL   LDL Cholesterol 96 0 - 99 mg/dL   Total CHOL/HDL Ratio 3    NonHDL 107.64   Hemoglobin A1c  Result Value Ref Range   Hgb A1c MFr Bld 5.5 4.6 - 6.5 %  Hepatic function panel  Result Value Ref Range   Total Bilirubin 0.5 0.2 - 1.2 mg/dL   Bilirubin, Direct 0.1 0.0 - 0.3 mg/dL   Alkaline Phosphatase 79 39 - 117 U/L   AST 30 0 - 37 U/L   ALT 42 0 - 53 U/L   Total Protein 7.1 6.0 - 8.3 g/dL   Albumin 4.2 3.5 - 5.2 g/dL  CBC with Differential/Platelet  Result Value Ref Range   WBC 5.6 4.0 - 10.5 K/uL   RBC 5.05 4.22 - 5.81 Mil/uL   Hemoglobin 16.2 13.0 - 17.0 g/dL   HCT 46.8 39.0 - 52.0 %   MCV 92.7 78.0 - 100.0 fl   MCHC 34.6 30.0 - 36.0 g/dL   RDW 13.4 11.5 - 15.5 %   Platelets 206.0 150.0 - 400.0 K/uL   Neutrophils Relative % 44.5 43.0 - 77.0 %   Lymphocytes Relative 40.0 12.0 - 46.0 %   Monocytes Relative 8.7 3.0 - 12.0 %   Eosinophils Relative 6.1 (H) 0.0 - 5.0 %   Basophils Relative 0.7 0.0 - 3.0 %   Neutro Abs 2.5 1.4 - 7.7 K/uL   Lymphs Abs 2.2 0.7 - 4.0 K/uL   Monocytes Absolute 0.5 0.1 - 1.0 K/uL   Eosinophils Absolute 0.3 0.0 - 0.7 K/uL   Basophils Absolute 0.0 0.0 - 0.1 K/uL  Basic metabolic panel  Result Value Ref Range   Sodium 141 135 - 145 mEq/L   Potassium 4.1 3.5 - 5.1 mEq/L   Chloride 105 96 - 112 mEq/L   CO2 30 19 - 32 mEq/L   Glucose, Bld 98 70 - 99 mg/dL   BUN 18 6 - 23 mg/dL   Creatinine, Ser 1.10 0.40 - 1.50 mg/dL   GFR 73.95 >60.00  mL/min   Calcium 9.1 8.4 - 10.5 mg/dL    Assessment and Plan:     ICD-10-CM   1. Healthcare maintenance  Z00.00       Health Maintenance Exam: The patient's preventative maintenance and recommended screening tests for an annual wellness exam were reviewed in full today. Brought up to date unless services declined.  Counselled on the importance of diet, exercise, and its role in overall health and mortality. The patient's FH and SH was reviewed, including their home life, tobacco status, and drug and alcohol status.  Follow-up in 1 year for physical exam or additional follow-up below.  Disposition: No follow-ups on file.  No orders of the defined types were placed in this encounter.  There are no discontinued medications. No orders of the defined types were placed in this encounter.   Signed,  Maud Deed. Tayvon Culley, MD   Allergies as of 11/23/2022   No Known Allergies      Medication List        Accurate as of November 22, 2022  5:24 PM. If you have any questions, ask your nurse or doctor.          aspirin EC 81 MG tablet Take 81 mg by mouth daily. Swallow whole.

## 2022-11-23 ENCOUNTER — Ambulatory Visit (INDEPENDENT_AMBULATORY_CARE_PROVIDER_SITE_OTHER): Payer: No Typology Code available for payment source | Admitting: Family Medicine

## 2022-11-23 ENCOUNTER — Encounter: Payer: Self-pay | Admitting: Family Medicine

## 2022-11-23 VITALS — BP 122/72 | HR 81 | Temp 98.1°F | Ht 68.75 in | Wt 189.1 lb

## 2022-11-23 DIAGNOSIS — Z0000 Encounter for general adult medical examination without abnormal findings: Secondary | ICD-10-CM

## 2023-10-28 ENCOUNTER — Telehealth: Payer: Self-pay | Admitting: *Deleted

## 2023-10-28 DIAGNOSIS — Z13220 Encounter for screening for lipoid disorders: Secondary | ICD-10-CM

## 2023-10-28 DIAGNOSIS — E291 Testicular hypofunction: Secondary | ICD-10-CM

## 2023-10-28 DIAGNOSIS — R5383 Other fatigue: Secondary | ICD-10-CM

## 2023-10-28 DIAGNOSIS — Z131 Encounter for screening for diabetes mellitus: Secondary | ICD-10-CM

## 2023-10-28 DIAGNOSIS — Z125 Encounter for screening for malignant neoplasm of prostate: Secondary | ICD-10-CM

## 2023-10-28 NOTE — Telephone Encounter (Signed)
-----   Message from Alvina Chou sent at 10/28/2023  2:58 PM EST ----- Regarding: Lab orders for [facility], 1.30.25 Patient is scheduled for CPX labs, please order future labs, Thanks , Camelia Eng

## 2023-11-18 ENCOUNTER — Other Ambulatory Visit (INDEPENDENT_AMBULATORY_CARE_PROVIDER_SITE_OTHER): Payer: No Typology Code available for payment source

## 2023-11-18 DIAGNOSIS — Z131 Encounter for screening for diabetes mellitus: Secondary | ICD-10-CM

## 2023-11-18 DIAGNOSIS — R5383 Other fatigue: Secondary | ICD-10-CM | POA: Diagnosis not present

## 2023-11-18 DIAGNOSIS — Z13220 Encounter for screening for lipoid disorders: Secondary | ICD-10-CM

## 2023-11-18 DIAGNOSIS — E291 Testicular hypofunction: Secondary | ICD-10-CM

## 2023-11-18 DIAGNOSIS — Z125 Encounter for screening for malignant neoplasm of prostate: Secondary | ICD-10-CM

## 2023-11-18 LAB — CBC WITH DIFFERENTIAL/PLATELET
Basophils Absolute: 0 10*3/uL (ref 0.0–0.1)
Basophils Relative: 1 % (ref 0.0–3.0)
Eosinophils Absolute: 0.3 10*3/uL (ref 0.0–0.7)
Eosinophils Relative: 6.6 % — ABNORMAL HIGH (ref 0.0–5.0)
HCT: 47.6 % (ref 39.0–52.0)
Hemoglobin: 15.7 g/dL (ref 13.0–17.0)
Lymphocytes Relative: 41.2 % (ref 12.0–46.0)
Lymphs Abs: 1.9 10*3/uL (ref 0.7–4.0)
MCHC: 33 g/dL (ref 30.0–36.0)
MCV: 96.3 fL (ref 78.0–100.0)
Monocytes Absolute: 0.5 10*3/uL (ref 0.1–1.0)
Monocytes Relative: 9.6 % (ref 3.0–12.0)
Neutro Abs: 2 10*3/uL (ref 1.4–7.7)
Neutrophils Relative %: 41.6 % — ABNORMAL LOW (ref 43.0–77.0)
Platelets: 196 10*3/uL (ref 150.0–400.0)
RBC: 4.94 Mil/uL (ref 4.22–5.81)
RDW: 13 % (ref 11.5–15.5)
WBC: 4.7 10*3/uL (ref 4.0–10.5)

## 2023-11-18 LAB — LIPID PANEL
Cholesterol: 145 mg/dL (ref 0–200)
HDL: 45.7 mg/dL (ref >39.00)
LDL Cholesterol: 88 mg/dL (ref 0–99)
NonHDL: 99.78
Total CHOL/HDL Ratio: 3
Triglycerides: 61 mg/dL (ref 0.0–149.0)
VLDL: 12.2 mg/dL (ref 0.0–40.0)

## 2023-11-18 LAB — BASIC METABOLIC PANEL
BUN: 23 mg/dL (ref 6–23)
CO2: 29 meq/L (ref 19–32)
Calcium: 9.2 mg/dL (ref 8.4–10.5)
Chloride: 104 meq/L (ref 96–112)
Creatinine, Ser: 1.07 mg/dL (ref 0.40–1.50)
GFR: 75.9 mL/min (ref >60.00)
Glucose, Bld: 87 mg/dL (ref 70–99)
Potassium: 4.3 meq/L (ref 3.5–5.1)
Sodium: 140 meq/L (ref 135–145)

## 2023-11-18 LAB — HEPATIC FUNCTION PANEL
ALT: 24 U/L (ref 0–53)
AST: 21 U/L (ref 0–37)
Albumin: 4.4 g/dL (ref 3.5–5.2)
Alkaline Phosphatase: 81 U/L (ref 39–117)
Bilirubin, Direct: 0.1 mg/dL (ref 0.0–0.3)
Total Bilirubin: 0.8 mg/dL (ref 0.2–1.2)
Total Protein: 6.9 g/dL (ref 6.0–8.3)

## 2023-11-18 LAB — HEMOGLOBIN A1C: Hgb A1c MFr Bld: 5.6 % (ref 4.6–6.5)

## 2023-11-24 NOTE — Progress Notes (Signed)
 Alex Qadir T. Alex Denherder, MD, CAQ Sports Medicine [facility] [address] [place], [address]  Phone: [phone]  FAX: [phone]  Alex Mack - 58 y.o. male  MRN [id number]  Date of Birth: December 18, 1965  Date: 11/25/2023  PCP: Watt Mirza, MD  Referral: Watt Mirza, MD  Chief Complaint  Patient presents with   Annual Exam   Patient Care Team: Watt Mirza, MD as PCP - General (Family Medicine) Subjective:   Alex Mack is a 58 y.o. pleasant patient who presents with the following:  Preventative Health Maintenance Visit:  Health Maintenance Summary Reviewed and updated, unless pt declines services.  Tobacco History Reviewed. Alcohol: No concerns, no excessive use - rare Exercise Habits: Some activity, rec at least 30 mins 5 times a week -he is still very physically active.  He continues to lift fairly heavy, he does Olympic lifting, and he is getting ready to resume his jujitsu training. STD concerns: no risk or activity to increase risk Drug Use: None  Flu - got at work Covid  Still doing a lot of olympic lifting  150/78 -blood pressure has been variable at home and the systolic it is ranged from the 130s to occasionally 160.  Health Maintenance  Topic Date Due   COVID-19 Vaccine (4 - 2024-25 season) 06/20/2023   Colonoscopy  10/19/2024   DTaP/Tdap/Td (2 - Td or Tdap) 03/22/2029   INFLUENZA VACCINE  Completed   Hepatitis C Screening  Completed   HIV Screening  Completed   Zoster Vaccines- Shingrix   Completed   HPV VACCINES  Aged Out   Immunization History  Administered Date(s) Administered   Influenza,inj,Quad PF,6+ Mos 08/18/2017   Influenza-Unspecified 08/13/2021, 08/19/2022, 09/13/2023   PFIZER(Purple Top)SARS-COV-2 Vaccination 11/10/2019, 12/01/2019, 08/31/2020   Tdap 03/23/2019   Zoster Recombinant(Shingrix ) 11/19/2021, 02/17/2022   Patient Active Problem List   Diagnosis Date Noted   Allergic  rhinitis    History of kidney stones     Past Medical History:  Diagnosis Date   Allergic rhinitis    Basal cell carcinoma    History of kidney stones     Past Surgical History:  Procedure Laterality Date   ADENOIDECTOMY     HERNIA REPAIR     NASAL FRACTURE SURGERY      Family History  Problem Relation Age of Onset   Hypertension Father    Breast cancer Neg Hx     Social History   Social History Narrative   Physical Therapist, [employer]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 (Scientist, Research (physical Sciences))   Some MMA, Muay Thai, BJJ in the past    Past Medical History, Surgical History, Social History, Family History, Problem List, Medications, and Allergies have been reviewed and updated if relevant.  Review of Systems: Pertinent positives are listed above.  Otherwise, a full 14 point review of systems has been done in full and it is negative except where it is noted positive.  Objective:   BP (!) 140/68 (BP Location: Left Arm, Patient Position: Sitting, Cuff Size: Large)   Pulse 63   Temp 97.9 F (36.6 C) (Temporal)   Ht 5' 8.5 (1.74 m)   Wt 188 lb 4 oz (85.4 kg)   SpO2 96%   BMI 28.21 kg/m  Ideal Body Weight: Weight in (lb) to have BMI = 25: 166.5  Ideal Body Weight: Weight in (lb) to have BMI = 25: 166.5 No results found.    11/25/2023  8:23 AM 11/23/2022    8:33 AM 11/19/2021    8:19 AM 11/18/2020    8:26 AM 03/23/2019    8:34 AM  Depression screen PHQ 2/9  Decreased Interest 0 0 0 0 0  Down, Depressed, Hopeless 0 0 0 0 0  PHQ - 2 Score 0 0 0 0 0     GEN: well developed, well nourished, no acute distress Eyes: conjunctiva and lids normal, PERRLA, EOMI ENT: TM clear, nares clear, oral exam WNL Neck: supple, no lymphadenopathy, no thyromegaly, no JVD Pulm: clear to auscultation and percussion, respiratory effort normal CV: regular rate and rhythm, S1-S2, no murmur, rub or gallop, no bruits, peripheral pulses normal and symmetric, no cyanosis,  clubbing, edema or varicosities GI: soft, non-tender; no hepatosplenomegaly, masses; active bowel sounds all quadrants GU: deferred Lymph: no cervical, axillary or inguinal adenopathy MSK: gait normal, muscle tone and strength WNL, no joint swelling, effusions, discoloration, crepitus  SKIN: clear, good turgor, color WNL, no rashes, lesions, or ulcerations Neuro: normal mental status, normal strength, sensation, and motion Psych: alert; oriented to person, place and time, normally interactive and not anxious or depressed in appearance.  All labs reviewed with patient. Results for orders placed or performed in visit on 11/18/23  Hepatic function panel   Collection Time: 11/18/23  7:24 AM  Result Value Ref Range   Total Bilirubin 0.8 0.2 - 1.2 mg/dL   Bilirubin, Direct 0.1 0.0 - 0.3 mg/dL   Alkaline Phosphatase 81 39 - 117 U/L   AST 21 0 - 37 U/L   ALT 24 0 - 53 U/L   Total Protein 6.9 6.0 - 8.3 g/dL   Albumin 4.4 3.5 - 5.2 g/dL  Basic metabolic panel   Collection Time: 11/18/23  7:24 AM  Result Value Ref Range   Sodium 140 135 - 145 mEq/L   Potassium 4.3 3.5 - 5.1 mEq/L   Chloride 104 96 - 112 mEq/L   CO2 29 19 - 32 mEq/L   Glucose, Bld 87 70 - 99 mg/dL   BUN 23 6 - 23 mg/dL   Creatinine, Ser 8.92 0.40 - 1.50 mg/dL   GFR 24.09 >39.99 mL/min   Calcium 9.2 8.4 - 10.5 mg/dL  CBC with Differential/Platelet   Collection Time: 11/18/23  7:24 AM  Result Value Ref Range   WBC 4.7 4.0 - 10.5 K/uL   RBC 4.94 4.22 - 5.81 Mil/uL   Hemoglobin 15.7 13.0 - 17.0 g/dL   HCT 52.3 60.9 - 47.9 %   MCV 96.3 78.0 - 100.0 fl   MCHC 33.0 30.0 - 36.0 g/dL   RDW 86.9 88.4 - 84.4 %   Platelets 196.0 150.0 - 400.0 K/uL   Neutrophils Relative % 41.6 (L) 43.0 - 77.0 %   Lymphocytes Relative 41.2 12.0 - 46.0 %   Monocytes Relative 9.6 3.0 - 12.0 %   Eosinophils Relative 6.6 (H) 0.0 - 5.0 %   Basophils Relative 1.0 0.0 - 3.0 %   Neutro Abs 2.0 1.4 - 7.7 K/uL   Lymphs Abs 1.9 0.7 - 4.0 K/uL    Monocytes Absolute 0.5 0.1 - 1.0 K/uL   Eosinophils Absolute 0.3 0.0 - 0.7 K/uL   Basophils Absolute 0.0 0.0 - 0.1 K/uL  Hemoglobin A1c   Collection Time: 11/18/23  7:24 AM  Result Value Ref Range   Hgb A1c MFr Bld 5.6 4.6 - 6.5 %  Lipid panel   Collection Time: 11/18/23  7:24 AM  Result Value Ref Range  Cholesterol 145 0 - 200 mg/dL   Triglycerides 38.9 0.0 - 149.0 mg/dL   HDL 54.29 >60.99 mg/dL   VLDL 87.7 0.0 - 59.9 mg/dL   LDL Cholesterol 88 0 - 99 mg/dL   Total CHOL/HDL Ratio 3    NonHDL 99.78   PSA, Total with Reflex to PSA, Free   Collection Time: 11/18/23  7:24 AM  Result Value Ref Range   PSA, Total 2.0 < OR = 4.0 ng/mL    Assessment and Plan:     ICD-10-CM   1. Healthcare maintenance  Z00.00      He is going to get his office to check his blood pressure once or twice a week over the next month.  If it is over 140/90, I think starting him on some blood pressure medicine would make the most sense.  Otherwise he is doing quite well.  Health Maintenance Exam: The patient's preventative maintenance and recommended screening tests for an annual wellness exam were reviewed in full today. Brought up to date unless services declined.  Counselled on the importance of diet, exercise, and its role in overall health and mortality. The patient's FH and SH was reviewed, including their home life, tobacco status, and drug and alcohol status.  Follow-up in 1 year for physical exam or additional follow-up below.  Disposition: No follow-ups on file.  No orders of the defined types were placed in this encounter.  Medications Discontinued During This Encounter  Medication Reason   aspirin 81 MG EC tablet Completed Course   No orders of the defined types were placed in this encounter.   Signed,  Jacques DASEN. Tyjon Bowen, MD   Allergies as of 11/25/2023   No Known Allergies      Medication List        Accurate as of November 25, 2023  8:27 AM. If you have any questions,  ask your nurse or doctor.          STOP taking these medications    aspirin EC 81 MG tablet Stopped by: Jacques Dorothe Elmore       TAKE these medications    Creatine Powd by Does not apply route daily.   multivitamin tablet Take 1 tablet by mouth daily.

## 2023-11-25 ENCOUNTER — Encounter: Payer: Self-pay | Admitting: Family Medicine

## 2023-11-25 ENCOUNTER — Ambulatory Visit (INDEPENDENT_AMBULATORY_CARE_PROVIDER_SITE_OTHER): Payer: No Typology Code available for payment source | Admitting: Family Medicine

## 2023-11-25 VITALS — BP 140/68 | HR 63 | Temp 97.9°F | Ht 68.5 in | Wt 188.2 lb

## 2023-11-25 DIAGNOSIS — Z0000 Encounter for general adult medical examination without abnormal findings: Secondary | ICD-10-CM | POA: Diagnosis not present

## 2023-11-26 ENCOUNTER — Encounter: Payer: Self-pay | Admitting: Family Medicine

## 2023-11-26 LAB — TESTOS,TOTAL,FREE AND SHBG (FEMALE)
Free Testosterone: 87.1 pg/mL (ref 35.0–155.0)
Sex Hormone Binding: 47.9 nmol/L (ref 22–77)
Testosterone, Total, LC-MS-MS: 502 ng/dL (ref 250–1100)

## 2023-11-26 LAB — PSA, TOTAL WITH REFLEX TO PSA, FREE: PSA, Total: 2 ng/mL (ref <=4.0)

## 2024-05-12 ENCOUNTER — Ambulatory Visit (HOSPITAL_COMMUNITY)
Admission: EM | Admit: 2024-05-12 | Discharge: 2024-05-12 | Disposition: A | Discharge status: Home / Self Care | Attending: Physician Assistant | Admitting: Physician Assistant

## 2024-05-12 ENCOUNTER — Encounter (HOSPITAL_COMMUNITY): Payer: Self-pay

## 2024-05-12 DIAGNOSIS — J029 Acute pharyngitis, unspecified: Secondary | ICD-10-CM | POA: Diagnosis present

## 2024-05-12 DIAGNOSIS — R590 Localized enlarged lymph nodes: Secondary | ICD-10-CM | POA: Diagnosis present

## 2024-05-12 LAB — CBC WITH DIFFERENTIAL/PLATELET
Abs Immature Granulocytes: 0.03 K/uL (ref 0.00–0.07)
Basophils Absolute: 0.1 K/uL (ref 0.0–0.1)
Basophils Relative: 1 %
Eosinophils Absolute: 0.2 K/uL (ref 0.0–0.5)
Eosinophils Relative: 2 %
HCT: 45.3 % (ref 39.0–52.0)
Hemoglobin: 15.5 g/dL (ref 13.0–17.0)
Immature Granulocytes: 0 %
Lymphocytes Relative: 27 %
Lymphs Abs: 2.3 K/uL (ref 0.7–4.0)
MCH: 31.9 pg (ref 26.0–34.0)
MCHC: 34.2 g/dL (ref 30.0–36.0)
MCV: 93.2 fL (ref 80.0–100.0)
Monocytes Absolute: 0.9 K/uL (ref 0.1–1.0)
Monocytes Relative: 11 %
Neutro Abs: 5 K/uL (ref 1.7–7.7)
Neutrophils Relative %: 59 %
Platelets: 245 K/uL (ref 150–400)
RBC: 4.86 MIL/uL (ref 4.22–5.81)
RDW: 11.9 % (ref 11.5–15.5)
WBC: 8.4 K/uL (ref 4.0–10.5)
nRBC: 0 % (ref 0.0–0.2)

## 2024-05-12 LAB — COMPREHENSIVE METABOLIC PANEL WITH GFR
ALT: 21 U/L (ref 0–44)
AST: 24 U/L (ref 15–41)
Albumin: 3.8 g/dL (ref 3.5–5.0)
Alkaline Phosphatase: 86 U/L (ref 38–126)
Anion gap: 10 (ref 5–15)
BUN: 17 mg/dL (ref 6–20)
CO2: 25 mmol/L (ref 22–32)
Calcium: 9 mg/dL (ref 8.9–10.3)
Chloride: 103 mmol/L (ref 98–111)
Creatinine, Ser: 1 mg/dL (ref 0.61–1.24)
GFR, Estimated: >60 mL/min (ref >60)
Glucose, Bld: 79 mg/dL (ref 70–99)
Potassium: 4.4 mmol/L (ref 3.5–5.1)
Sodium: 138 mmol/L (ref 135–145)
Total Bilirubin: 0.9 mg/dL (ref 0.0–1.2)
Total Protein: 7.5 g/dL (ref 6.5–8.1)

## 2024-05-12 LAB — POCT RAPID STREP A (OFFICE): Rapid Strep A Screen: NEGATIVE

## 2024-05-12 LAB — POCT MONO SCREEN (KUC): Mono, POC: NEGATIVE

## 2024-05-12 MED ORDER — AMOXICILLIN-POT CLAVULANATE 875-125 MG PO TABS: 1.0000 | ORAL_TABLET | Freq: Two times a day (BID) | ORAL | 0 refills | Status: AC

## 2024-05-12 NOTE — ED Provider Notes (Signed)
 [facility]    CSN: [id number] Arrival date & time: 05/12/24  1224      History   Chief Complaint No chief complaint on file.   HPI [name] is a 58 y.o. male.   Patient presents today with a 10-day history of sore throat.  He reports that he has noted to swollen lymph nodes on the left side and so that prompted his evaluation today.  He reports that pain is rated 5 on a 0-10 pain scale, described as aching, worse with swallowing, no alleviating factors identified.  He did have chills and subjective fever when symptoms first began but these have since resolved and he denies any ongoing symptoms other than the sore throat including cough, congestion, fever, nausea, vomiting.  He has not been taking any over-the-counter medication for symptom management.  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  He denies any recent antibiotics or steroids.  He denies any history of malignancy other than basal cell carcinoma that was removed from his posterior neck in the area where there is lymphadenopathy.  Denies any known weight loss, night sweats, ongoing fever.    Past Medical History:  Diagnosis Date   Allergic rhinitis    Basal cell carcinoma    History of kidney stones     Patient Active Problem List   Diagnosis Date Noted   Allergic rhinitis    History of kidney stones     Past Surgical History:  Procedure Laterality Date   ADENOIDECTOMY     HERNIA REPAIR     NASAL FRACTURE SURGERY         Home Medications    Prior to Admission medications   Medication Sig Start Date End Date Taking? Authorizing Provider  amoxicillin-clavulanate (AUGMENTIN) 875-125 MG tablet Take 1 tablet by mouth every 12 (twelve) hours. 05/12/24  Yes Neftali Thurow K, PA-C  Creatine POWD by Does not apply route daily.    [provider]  Multiple Vitamin (MULTIVITAMIN) tablet Take 1 tablet by mouth daily.    [provider]     Family History Family History  Problem Relation Age of Onset   Hypertension Father    Breast cancer Neg Hx     Social History Social History   Tobacco Use   Smoking status: Never   Smokeless tobacco: Former  Substance Use Topics   Alcohol use: Yes    Alcohol/week: 2.0 standard drinks of alcohol    Types: 2 Cans of beer per week    Comment: 2 beer a month   Drug use: No     Allergies   Patient has no known allergies.   Review of Systems Review of Systems  Constitutional:  Positive for activity change and chills (resolved). Negative for appetite change, fatigue and fever.  HENT:  Positive for sore throat and trouble swallowing. Negative for congestion, sinus pressure, sneezing and voice change.   Respiratory:  Negative for cough and shortness of breath.   Cardiovascular:  Negative for chest pain.  Gastrointestinal:  Negative for abdominal pain, diarrhea, nausea and vomiting.  Neurological:  Negative for dizziness, light-headedness and headaches.  Hematological:  Positive for adenopathy.     Physical Exam Triage Vital Signs ED Triage Vitals  Encounter Vitals Group     BP 05/12/24 1249 (!) 156/89     Girls Systolic BP Percentile --      Girls Diastolic BP Percentile --  Boys Systolic BP Percentile --      Boys Diastolic BP Percentile --      Pulse Rate 05/12/24 1249 68     Resp 05/12/24 1249 18     Temp 05/12/24 1249 97.6 F (36.4 C)     Temp Source 05/12/24 1249 Oral     SpO2 05/12/24 1249 96 %     Weight --      Height --      Head Circumference --      Peak Flow --      Pain Score 05/12/24 1250 5     Pain Loc --      Pain Education --      Exclude from Growth Chart --    No data found.  Updated Vital Signs BP (!) 156/89 (BP Location: Left Arm)   Pulse 68   Temp 97.6 F (36.4 C) (Oral)   Resp 18   SpO2 96%   Visual Acuity Right Eye Distance:   Left Eye Distance:   Bilateral Distance:    Right Eye Near:   Left Eye Near:     Bilateral Near:     Physical Exam Vitals reviewed.  Constitutional:      General: He is awake.     Appearance: Normal appearance. He is well-developed. He is not ill-appearing.     Comments: Very pleasant male appears stated age in no acute distress sitting comfortably in exam room  HENT:     Head: Normocephalic and atraumatic.     Right Ear: Tympanic membrane, ear canal and external ear normal. Tympanic membrane is not erythematous or bulging.     Left Ear: Tympanic membrane, ear canal and external ear normal. Tympanic membrane is not erythematous or bulging.     Nose: Nose normal.     Mouth/Throat:     Pharynx: Uvula midline. Posterior oropharyngeal erythema present. No oropharyngeal exudate or uvula swelling.     Tonsils: No tonsillar exudate or tonsillar abscesses. 2+ on the right. 2+ on the left.  Cardiovascular:     Rate and Rhythm: Normal rate and regular rhythm.     Heart sounds: Normal heart sounds, S1 normal and S2 normal. No murmur heard. Pulmonary:     Effort: Pulmonary effort is normal. No accessory muscle usage or respiratory distress.     Breath sounds: Normal breath sounds. No stridor. No wheezing, rhonchi or rales.     Comments: Clear to auscultation bilaterally Lymphadenopathy:     Head:     Right side of head: No submental, submandibular or tonsillar adenopathy.     Left side of head: Submandibular adenopathy present. No submental or tonsillar adenopathy.     Cervical: Cervical adenopathy present.     Right cervical: No superficial or deep cervical adenopathy.    Left cervical: Superficial cervical adenopathy present. No deep cervical adenopathy.  Neurological:     Mental Status: He is alert.  Psychiatric:        Behavior: Behavior is cooperative.      UC Treatments / Results  Labs (all labs ordered are listed, but only abnormal results are displayed) Labs Reviewed  CBC WITH DIFFERENTIAL/PLATELET  COMPREHENSIVE METABOLIC PANEL WITH GFR   ANTISTREPTOLYSIN O TITER  POCT RAPID STREP A (OFFICE)  POCT MONO SCREEN ([facility])    EKG   Radiology No results found.  Procedures Procedures (including critical care time)  Medications Ordered in UC Medications - No data to display  Initial Impression / Assessment and Plan /  UC Course  I have reviewed the triage vital signs and the nursing notes.  Pertinent labs & imaging results that were available during my care of the patient were reviewed by me and considered in my medical decision making (see chart for details).     Patient is well-appearing, afebrile, nontoxic, nontachycardic.  He was negative for strep and mono in clinic today.  He does have significant left-sided cervical lymphadenopathy so will cover with Augmentin given associated sore throat.  No indication for dose adjustment based on metabolic panel from 11/18/2023 with creatinine of 1.07 and calculated creatinine clearance of 88.55 mL/min.  CBC, CMP, ASO titer obtained and pending.  We will contact him if this is abnormal and changes to treatment plan.  We discussed that if his symptoms are not improving with the antibiotic he should follow-up with his primary care to consider imaging since we do not have access to this in urgent care.  If he develops any worsening symptoms including enlarging lymph nodes, weight loss, fever, swelling of his throat, dysphagia, muffled voice he needs to be seen immediately.  Strict return precautions given.  All questions answered to patient satisfaction.    Final Clinical Impressions(s) / UC Diagnoses   Final diagnoses:  Acute pharyngitis, unspecified etiology  Cervical lymphadenopathy     Discharge Instructions      We are treating you for an infection of your throat.  Take Augmentin twice daily for 7 days.  I will contact you if any of your blood work is abnormal.  Gargle with warm salt water and use Tylenol for pain relief.  If your sore throat does not improve and the lymph  nodes go down within a few weeks please follow-up with your primary care as you may need imaging to investigate this further but we do not have access to an urgent care.  If anything worsens and you have swelling of your throat, shortness of breath, trouble swallowing, weight loss, fever, sweating at night you need to be seen immediately.  If you develop any rash please return for reevaluation as we discussed.     ED Prescriptions     Medication Sig Dispense Auth. Provider   amoxicillin-clavulanate (AUGMENTIN) 875-125 MG tablet Take 1 tablet by mouth every 12 (twelve) hours. 14 tablet Rivers Hamrick K, PA-C      PDMP not reviewed this encounter.   Sherrell Rocky POUR, PA-C 05/12/24 1412

## 2024-05-12 NOTE — Discharge Instructions (Addendum)
 We are treating you for an infection of your throat.  Take Augmentin twice daily for 7 days.  I will contact you if any of your blood work is abnormal.  Gargle with warm salt water and use Tylenol for pain relief.  If your sore throat does not improve and the lymph nodes go down within a few weeks please follow-up with your primary care as you may need imaging to investigate this further but we do not have access to an urgent care.  If anything worsens and you have swelling of your throat, shortness of breath, trouble swallowing, weight loss, fever, sweating at night you need to be seen immediately.  If you develop any rash please return for reevaluation as we discussed.

## 2024-05-12 NOTE — ED Triage Notes (Signed)
 Patient presents to the office for sore throat and swelling in his throat. Had chills last week but feels better now.

## 2024-05-14 LAB — ANTISTREPTOLYSIN O TITER: ASO: 477 [IU]/mL — ABNORMAL HIGH (ref 0.0–200.0)

## 2024-05-15 ENCOUNTER — Ambulatory Visit (HOSPITAL_COMMUNITY): Payer: Self-pay

## 2024-05-15 NOTE — Telephone Encounter (Signed)
 Results are consistent with exposure to group A strep.  Continue antibiotics as prescribed.  No change to plan of care.

## 2024-05-29 ENCOUNTER — Encounter: Payer: Self-pay | Admitting: Family Medicine

## 2024-05-30 MED ORDER — LOSARTAN POTASSIUM 25 MG PO TABS: 25.0000 mg | ORAL_TABLET | Freq: Every day | ORAL | 2 refills | Status: DC

## 2024-06-12 ENCOUNTER — Encounter: Payer: Self-pay | Admitting: Family Medicine

## 2024-06-12 DIAGNOSIS — K921 Melena: Secondary | ICD-10-CM

## 2024-06-12 DIAGNOSIS — Z1211 Encounter for screening for malignant neoplasm of colon: Secondary | ICD-10-CM

## 2024-06-13 MED ORDER — LOSARTAN POTASSIUM 50 MG PO TABS: 50.0000 mg | ORAL_TABLET | Freq: Every day | ORAL | 3 refills | Status: DC

## 2024-06-30 NOTE — Addendum Note (Signed)
 Addended by: WATT MIRZA on: 06/30/2024 05:17 PM   Modules accepted: Orders

## 2024-07-10 ENCOUNTER — Encounter: Payer: Self-pay | Admitting: Family Medicine

## 2024-07-24 MED ORDER — LOSARTAN POTASSIUM 100 MG PO TABS: 100.0000 mg | ORAL_TABLET | Freq: Every day | ORAL | 1 refills | Status: AC

## 2024-07-24 NOTE — Telephone Encounter (Signed)
 Please advise.  Medication list states 50 mg daily.

## 2024-09-26 ENCOUNTER — Encounter: Payer: Self-pay | Admitting: Gastroenterology

## 2024-11-02 ENCOUNTER — Ambulatory Visit: Admitting: Gastroenterology

## 2024-11-02 ENCOUNTER — Encounter: Payer: Self-pay | Admitting: Gastroenterology

## 2024-11-02 VITALS — BP 150/80 | HR 68 | Ht 69.0 in | Wt 190.4 lb

## 2024-11-02 DIAGNOSIS — Z1211 Encounter for screening for malignant neoplasm of colon: Secondary | ICD-10-CM

## 2024-11-02 DIAGNOSIS — K625 Hemorrhage of anus and rectum: Secondary | ICD-10-CM

## 2024-11-02 MED ORDER — SUTAB 1479-225-188 MG PO TABS: 24.0000 | ORAL_TABLET | ORAL | 0 refills | Status: AC

## 2024-11-02 NOTE — Patient Instructions (Signed)
 _______________________________________________________  If your blood pressure at your visit was 140/90 or greater, please contact your primary care physician to follow up on this.  _______________________________________________________  If you are age 61 or older, your body mass index should be between 23-30. Your Body mass index is 28.11 kg/m. If this is out of the aforementioned range listed, please consider follow up with your Primary Care Provider.  If you are age 68 or younger, your body mass index should be between 19-25. Your Body mass index is 28.11 kg/m. If this is out of the aformentioned range listed, please consider follow up with your Primary Care Provider.   ________________________________________________________  The [place] [facility] providers would like to encourage you to use MYCHART to communicate with providers for non-urgent requests or questions.  Due to long hold times on the telephone, sending your provider a message by [facility] may be a faster and more efficient way to get a response.  Please allow 48 business hours for a response.  Please remember that this is for non-urgent requests.  _______________________________________________________  [facility] is using a team-based approach to care.  Your team is made up of your doctor and two to three APPS. Our APPS (Nurse Practitioners and Physician Assistants) work with your physician to ensure care continuity for you. They are fully qualified to address your health concerns and develop a treatment plan. They communicate directly with your gastroenterologist to care for you. Seeing the Advanced Practice Practitioners on your physician's team can help you by facilitating care more promptly, often allowing for earlier appointments, access to diagnostic testing, procedures, and other specialty referrals.   We have sent the following medications to your pharmacy for you to pick up at your convenience: Sutab   You have  been scheduled for a colonoscopy. Please follow written instructions given to you at your visit today.   If you use inhalers (even only as needed), please bring them with you on the day of your procedure.  DO NOT TAKE 7 DAYS PRIOR TO TEST- Trulicity (dulaglutide) Ozempic, Wegovy (semaglutide) Mounjaro, Zepbound (tirzepatide) Bydureon Bcise (exanatide extended release)  DO NOT TAKE 1 DAY PRIOR TO YOUR TEST Rybelsus (semaglutide) Adlyxin (lixisenatide) Victoza (liraglutide) Byetta (exanatide) ___________________________________________________________________________   It was a pleasure to see you today!  Thank you for trusting me with your gastrointestinal care!

## 2024-11-02 NOTE — Progress Notes (Signed)
 "  Chief Complaint: rectal bleeding Primary GI MD: Sampson  HPI: Discussed the use of AI scribe software for clinical note transcription with the patient, who gave verbal consent to proceed.  History of Present Illness   Alex Mack is a 59 year old male who presents for evaluation of rectal bleeding.  Approximately three months ago, he experienced an episode of bright red blood in his stool, which lasted a few days and has not recurred. Blood was noted both in the toilet and on tissue paper. He did not experience pain, straining with bowel movements, nausea, vomiting, or weight loss during this episode. He does not recall any prior significant episodes, though he mentioned a possible minimal episode of red blood five to ten years ago that was not concerning at the time.  His last colonoscopy was performed in February 2016 at the [facility]; he does not recall whether polyps were found. 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 14 tablet 0   Creatine POWD by Does not apply route daily.     losartan  (COZAAR ) 100 MG tablet Take 1 tablet (100 mg total) by mouth daily. 90 tablet 1   Multiple Vitamin (MULTIVITAMIN) tablet Take 1 tablet by mouth daily.     No current facility-administered medications for this visit.    Allergies as of 11/02/2024   (No Known Allergies)    Family History  Problem Relation Age of Onset   Hypertension Father    Breast cancer Neg Hx     Social History    Socioeconomic History   Marital status: Married    Spouse name: Not on file   Number of children: Not on file   Years of education: Not on file   Highest education level: Not on file  Occupational History   Occupation: Physical Therapist    Comment: [employer]  Tobacco Use   Smoking status: Never   Smokeless tobacco: Former  Substance and Sexual Activity   Alcohol use: Yes    Alcohol/week: 2.0 standard drinks of alcohol    Types: 2 Cans of beer per week    Comment: 2 beer a month   Drug use: No   Sexual activity: Yes    Partners: Female  Other Topics Concern   Not on file  Social History Narrative   Physical Therapist, [employer]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 (Scientist, Research (physical Sciences))   Some MMA, Muay Thai, BJJ in the past   Social Drivers of Health   Tobacco Use: Medium Risk (05/12/2024)   Patient History    Smoking Tobacco Use: Never    Smokeless Tobacco Use: Former    Passive Exposure: Not on Actuary Strain: Not on file  Food Insecurity: Not on file  Transportation Needs: Not on file  Physical Activity: Not on file  Stress: Not on file  Social Connections: Not on file  Intimate Partner Violence:  Not on file  Depression (PHQ2-9): Low Risk (11/25/2023)   Depression (PHQ2-9)    PHQ-2 Score: 0  Alcohol Screen: Not on file  Housing: Not on file  Utilities: Not on file  Health Literacy: Not on file    Review of Systems:    Constitutional: No weight loss, fever, chills, weakness or fatigue HEENT: Eyes: No change in vision               Ears, Nose, Throat:  No change in hearing or congestion Skin: No rash or itching Cardiovascular: No chest pain, chest pressure or palpitations   Respiratory: No SOB or cough Gastrointestinal: See HPI and otherwise negative Genitourinary: No dysuria or change in urinary frequency Neurological: No headache, dizziness or syncope Musculoskeletal: No new muscle or joint pain Hematologic:  No bleeding or bruising Psychiatric: No history of depression or anxiety    Physical Exam:  Vital signs: There were no vitals taken for this visit.  Constitutional: NAD, alert and cooperative Head:  Normocephalic and atraumatic. Eyes:   PEERL, EOMI. No icterus. Conjunctiva pink. Respiratory: Respirations even and unlabored. Lungs clear to auscultation bilaterally.   No wheezes, crackles, or rhonchi.  Cardiovascular:  Regular rate and rhythm. No peripheral edema, cyanosis or pallor.  Gastrointestinal:  Soft, nondistended, nontender. No rebound or guarding. Normal bowel sounds. No appreciable masses or hepatomegaly. Rectal:  Declines Msk:  Symmetrical without gross deformities. Without edema, no deformity or joint abnormality.  Neurologic:  Alert and  oriented x4;  grossly normal neurologically.  Skin:   Dry and intact without significant lesions or rashes. Psychiatric: Oriented to person, place and time. Demonstrates good judgement and reason without abnormal affect or behaviors.  Physical Exam    RELEVANT LABS AND IMAGING: CBC    Component Value Date/Time   WBC 8.4 05/12/2024 1349   RBC 4.86 05/12/2024 1349   HGB 15.5 05/12/2024 1349   HCT 45.3 05/12/2024 1349   PLT 245 05/12/2024 1349   MCV 93.2 05/12/2024 1349   MCH 31.9 05/12/2024 1349   MCHC 34.2 05/12/2024 1349   RDW 11.9 05/12/2024 1349   LYMPHSABS 2.3 05/12/2024 1349   MONOABS 0.9 05/12/2024 1349   EOSABS 0.2 05/12/2024 1349   BASOSABS 0.1 05/12/2024 1349    CMP     Component Value Date/Time   NA 138 05/12/2024 1349   K 4.4 05/12/2024 1349   CL 103 05/12/2024 1349   CO2 25 05/12/2024 1349   GLUCOSE 79 05/12/2024 1349   BUN 17 05/12/2024 1349   CREATININE 1.00 05/12/2024 1349   CALCIUM 9.0 05/12/2024 1349   PROT 7.5 05/12/2024 1349   ALBUMIN 3.8 05/12/2024 1349   AST 24 05/12/2024 1349   ALT 21 05/12/2024 1349   ALKPHOS 86 05/12/2024 1349   BILITOT 0.9 05/12/2024 1349   GFRNONAA >60 05/12/2024 1349      Assessment/Plan:   Colorectal cancer screening Due for screening colonoscopy for cancer prevention, asymptomatic, with minimal procedural risk. - Ordered screening colonoscopy. - Provided education on colonoscopy, polypectomy, and cancer prevention. - requesting sutab  - I thoroughly discussed the procedure with the patient (at bedside) to include nature of the procedure, alternatives, benefits, and risks (including but not limited to bleeding, infection, perforation, anesthesia/cardiac pulmonary complications).  Patient verbalized understanding and gave verbal consent to proceed with procedure.   History of rectal bleeding Brief, self-limited episode of painless bright red rectal bleeding resolved; likely hemorrhoidal or diverticular, benign. - resolved   Nestor Mollie RIGGERS [facility] 11/02/2024,  1:34 PM  Cc: Watt Mirza, MD "

## 2024-11-06 NOTE — Progress Notes (Signed)
 Agree with the assessment and plan as outlined by Boone Master, PA-C.

## 2024-11-09 ENCOUNTER — Telehealth: Payer: Self-pay | Admitting: *Deleted

## 2024-11-09 DIAGNOSIS — Z131 Encounter for screening for diabetes mellitus: Secondary | ICD-10-CM

## 2024-11-09 DIAGNOSIS — Z13220 Encounter for screening for lipoid disorders: Secondary | ICD-10-CM

## 2024-11-09 DIAGNOSIS — E291 Testicular hypofunction: Secondary | ICD-10-CM

## 2024-11-09 DIAGNOSIS — Z125 Encounter for screening for malignant neoplasm of prostate: Secondary | ICD-10-CM

## 2024-11-09 DIAGNOSIS — R5383 Other fatigue: Secondary | ICD-10-CM

## 2024-11-09 NOTE — Telephone Encounter (Signed)
-----   Message from Alex Mack sent at 11/08/2024  1:58 PM EST ----- Regarding: Lab Mon 11/20/24 Hello,  Patient is coming in for CPE labs. Can we get orders please.   Thanks

## 2024-11-20 ENCOUNTER — Other Ambulatory Visit: Payer: No Typology Code available for payment source

## 2024-11-22 ENCOUNTER — Other Ambulatory Visit

## 2024-11-22 ENCOUNTER — Encounter: Payer: Self-pay | Admitting: Gastroenterology

## 2024-11-22 DIAGNOSIS — Z131 Encounter for screening for diabetes mellitus: Secondary | ICD-10-CM

## 2024-11-22 DIAGNOSIS — E291 Testicular hypofunction: Secondary | ICD-10-CM

## 2024-11-22 DIAGNOSIS — Z125 Encounter for screening for malignant neoplasm of prostate: Secondary | ICD-10-CM

## 2024-11-22 DIAGNOSIS — R5383 Other fatigue: Secondary | ICD-10-CM | POA: Diagnosis not present

## 2024-11-22 DIAGNOSIS — Z13220 Encounter for screening for lipoid disorders: Secondary | ICD-10-CM

## 2024-11-22 LAB — BASIC METABOLIC PANEL WITH GFR
BUN: 18 mg/dL (ref 6–23)
CO2: 30 meq/L (ref 19–32)
Calcium: 9.5 mg/dL (ref 8.4–10.5)
Chloride: 106 meq/L (ref 96–112)
Creatinine, Ser: 0.91 mg/dL (ref 0.40–1.50)
GFR: 91.53 mL/min
Glucose, Bld: 98 mg/dL (ref 70–99)
Potassium: 4.5 meq/L (ref 3.5–5.1)
Sodium: 141 meq/L (ref 135–145)

## 2024-11-22 LAB — HEPATIC FUNCTION PANEL
ALT: 24 U/L (ref 3–53)
AST: 22 U/L (ref 5–37)
Albumin: 4.2 g/dL (ref 3.5–5.2)
Alkaline Phosphatase: 65 U/L (ref 39–117)
Bilirubin, Direct: 0.2 mg/dL (ref 0.1–0.3)
Total Bilirubin: 0.9 mg/dL (ref 0.2–1.2)
Total Protein: 6.8 g/dL (ref 6.0–8.3)

## 2024-11-22 LAB — CBC WITH DIFFERENTIAL/PLATELET
Basophils Absolute: 0 10*3/uL (ref 0.0–0.1)
Basophils Relative: 0.8 % (ref 0.0–3.0)
Eosinophils Absolute: 0.3 10*3/uL (ref 0.0–0.7)
Eosinophils Relative: 6.7 % — ABNORMAL HIGH (ref 0.0–5.0)
HCT: 44.8 % (ref 39.0–52.0)
Hemoglobin: 15.2 g/dL (ref 13.0–17.0)
Lymphocytes Relative: 42.7 % (ref 12.0–46.0)
Lymphs Abs: 1.9 10*3/uL (ref 0.7–4.0)
MCHC: 34 g/dL (ref 30.0–36.0)
MCV: 94.3 fl (ref 78.0–100.0)
Monocytes Absolute: 0.4 10*3/uL (ref 0.1–1.0)
Monocytes Relative: 8.9 % (ref 3.0–12.0)
Neutro Abs: 1.8 10*3/uL (ref 1.4–7.7)
Neutrophils Relative %: 40.9 % — ABNORMAL LOW (ref 43.0–77.0)
Platelets: 185 10*3/uL (ref 150.0–400.0)
RBC: 4.75 Mil/uL (ref 4.22–5.81)
RDW: 13.1 % (ref 11.5–15.5)
WBC: 4.4 10*3/uL (ref 4.0–10.5)

## 2024-11-22 LAB — LIPID PANEL
Cholesterol: 145 mg/dL (ref 28–200)
HDL: 42.7 mg/dL
LDL Cholesterol: 87 mg/dL (ref 10–99)
NonHDL: 102.68
Total CHOL/HDL Ratio: 3
Triglycerides: 79 mg/dL (ref 10.0–149.0)
VLDL: 15.8 mg/dL (ref 0.0–40.0)

## 2024-11-22 LAB — HEMOGLOBIN A1C: Hgb A1c MFr Bld: 5.4 % (ref 4.6–6.5)

## 2024-11-24 LAB — PSA, TOTAL WITH REFLEX TO PSA, FREE: PSA, Total: 1.5 ng/mL

## 2024-11-27 ENCOUNTER — Encounter: Admitting: Gastroenterology

## 2024-11-27 ENCOUNTER — Encounter: Payer: No Typology Code available for payment source | Admitting: Family Medicine

## 2024-11-29 ENCOUNTER — Encounter: Admitting: Gastroenterology
